# Patient Record
Sex: Male | Born: 1950 | Race: Black or African American | Hispanic: No | Marital: Married | Smoking: Never smoker
Health system: Southern US, Community
[De-identification: ages and names within clinical notes are randomized; demographics above are authoritative.]

## PROBLEM LIST (undated history)

## (undated) DIAGNOSIS — K259 Gastric ulcer, unspecified as acute or chronic, without hemorrhage or perforation: Secondary | ICD-10-CM

## (undated) DIAGNOSIS — K219 Gastro-esophageal reflux disease without esophagitis: Secondary | ICD-10-CM

## (undated) DIAGNOSIS — R12 Heartburn: Secondary | ICD-10-CM

## (undated) DIAGNOSIS — E7800 Pure hypercholesterolemia, unspecified: Secondary | ICD-10-CM

## (undated) DIAGNOSIS — I10 Essential (primary) hypertension: Secondary | ICD-10-CM

## (undated) DIAGNOSIS — C7490 Malignant neoplasm of unspecified part of unspecified adrenal gland: Secondary | ICD-10-CM

## (undated) DIAGNOSIS — E220 Acromegaly and pituitary gigantism: Secondary | ICD-10-CM

## (undated) DIAGNOSIS — E559 Vitamin D deficiency, unspecified: Secondary | ICD-10-CM

## (undated) DIAGNOSIS — E237 Disorder of pituitary gland, unspecified: Secondary | ICD-10-CM

## (undated) DIAGNOSIS — G4730 Sleep apnea, unspecified: Secondary | ICD-10-CM

## (undated) HISTORY — DX: Essential (primary) hypertension: I10

## (undated) HISTORY — DX: Disorder of pituitary gland, unspecified: E23.7

## (undated) HISTORY — DX: Pure hypercholesterolemia, unspecified: E78.00

## (undated) HISTORY — DX: Gastro-esophageal reflux disease without esophagitis: K21.9

## (undated) HISTORY — DX: Gastric ulcer, unspecified as acute or chronic, without hemorrhage or perforation: K25.9

## (undated) HISTORY — DX: Malignant neoplasm of unspecified part of unspecified adrenal gland: C74.90

## (undated) HISTORY — PX: CHOLECYSTECTOMY: SHX55

## (undated) HISTORY — DX: Sleep apnea, unspecified: G47.30

## (undated) HISTORY — DX: Vitamin D deficiency, unspecified: E55.9

## (undated) HISTORY — DX: Acromegaly and pituitary gigantism: E22.0

## (undated) HISTORY — DX: Heartburn: R12

---

## 1989-08-18 HISTORY — PX: FACIAL RECONSTRUCTION SURGERY: SHX631

## 2003-08-19 HISTORY — PX: ADRENALECTOMY: SHX876

## 2015-08-19 HISTORY — PX: PROSTATE BIOPSY: SHX241

## 2020-03-15 DIAGNOSIS — E785 Hyperlipidemia, unspecified: Secondary | ICD-10-CM | POA: Diagnosis not present

## 2020-03-15 DIAGNOSIS — K2100 Gastro-esophageal reflux disease with esophagitis, without bleeding: Secondary | ICD-10-CM | POA: Diagnosis not present

## 2020-03-15 DIAGNOSIS — E291 Testicular hypofunction: Secondary | ICD-10-CM | POA: Diagnosis not present

## 2020-03-15 DIAGNOSIS — Z0001 Encounter for general adult medical examination with abnormal findings: Secondary | ICD-10-CM | POA: Diagnosis not present

## 2020-03-15 DIAGNOSIS — I10 Essential (primary) hypertension: Secondary | ICD-10-CM | POA: Diagnosis not present

## 2020-03-15 DIAGNOSIS — Z79899 Other long term (current) drug therapy: Secondary | ICD-10-CM | POA: Diagnosis not present

## 2020-03-29 DIAGNOSIS — Z0001 Encounter for general adult medical examination with abnormal findings: Secondary | ICD-10-CM | POA: Diagnosis not present

## 2020-03-29 DIAGNOSIS — K2100 Gastro-esophageal reflux disease with esophagitis, without bleeding: Secondary | ICD-10-CM | POA: Diagnosis not present

## 2020-03-29 DIAGNOSIS — Z1331 Encounter for screening for depression: Secondary | ICD-10-CM | POA: Diagnosis not present

## 2020-03-29 DIAGNOSIS — I10 Essential (primary) hypertension: Secondary | ICD-10-CM | POA: Diagnosis not present

## 2020-03-29 DIAGNOSIS — Z1389 Encounter for screening for other disorder: Secondary | ICD-10-CM | POA: Diagnosis not present

## 2020-03-29 DIAGNOSIS — E785 Hyperlipidemia, unspecified: Secondary | ICD-10-CM | POA: Diagnosis not present

## 2020-04-29 DIAGNOSIS — K2100 Gastro-esophageal reflux disease with esophagitis, without bleeding: Secondary | ICD-10-CM | POA: Diagnosis not present

## 2020-04-29 DIAGNOSIS — I10 Essential (primary) hypertension: Secondary | ICD-10-CM | POA: Diagnosis not present

## 2020-05-08 DIAGNOSIS — Z23 Encounter for immunization: Secondary | ICD-10-CM | POA: Diagnosis not present

## 2020-05-09 ENCOUNTER — Ambulatory Visit (INDEPENDENT_AMBULATORY_CARE_PROVIDER_SITE_OTHER): Payer: Medicare HMO | Admitting: Urology

## 2020-05-09 ENCOUNTER — Encounter: Payer: Self-pay | Admitting: Urology

## 2020-05-09 ENCOUNTER — Other Ambulatory Visit: Payer: Self-pay

## 2020-05-09 VITALS — BP 133/84 | HR 62 | Temp 97.5°F | Ht 69.0 in | Wt 215.0 lb

## 2020-05-09 DIAGNOSIS — E291 Testicular hypofunction: Secondary | ICD-10-CM

## 2020-05-09 DIAGNOSIS — N5201 Erectile dysfunction due to arterial insufficiency: Secondary | ICD-10-CM

## 2020-05-09 LAB — MICROSCOPIC EXAMINATION
Bacteria, UA: NONE SEEN
Epithelial Cells (non renal): NONE SEEN /hpf (ref 0–10)
Renal Epithel, UA: NONE SEEN /hpf
WBC, UA: NONE SEEN /hpf (ref 0–5)

## 2020-05-09 LAB — URINALYSIS, ROUTINE W REFLEX MICROSCOPIC
Bilirubin, UA: NEGATIVE
Glucose, UA: NEGATIVE
Ketones, UA: NEGATIVE
Leukocytes,UA: NEGATIVE
Nitrite, UA: NEGATIVE
Protein,UA: NEGATIVE
Specific Gravity, UA: 1.025 (ref 1.005–1.030)
Urobilinogen, Ur: 1 mg/dL (ref 0.2–1.0)
pH, UA: 5.5 (ref 5.0–7.5)

## 2020-05-09 MED ORDER — SILDENAFIL CITRATE 100 MG PO TABS
100.0000 mg | ORAL_TABLET | Freq: Every day | ORAL | 5 refills | Status: DC | PRN
Start: 2020-05-09 — End: 2021-07-10

## 2020-05-09 MED ORDER — TESTOSTERONE CYPIONATE 200 MG/ML IM SOLN: 100.0000 mg | INTRAMUSCULAR | 0 refills | Status: DC

## 2020-05-09 NOTE — Patient Instructions (Signed)
Testosterone injection What is this medicine? TESTOSTERONE (tes TOS ter one) is the main male hormone. It supports normal male development such as muscle growth, facial hair, and deep voice. It is used in males to treat low testosterone levels. This medicine may be used for other purposes; ask your health care provider or pharmacist if you have questions. COMMON BRAND NAME(S): Andro-L.A., Aveed, Delatestryl, Depo-Testosterone, Virilon What should I tell my health care provider before I take this medicine? They need to know if you have any of these conditions:  cancer  diabetes  heart disease  kidney disease  liver disease  lung disease  prostate disease  an unusual or allergic reaction to testosterone, other medicines, foods, dyes, or preservatives  pregnant or trying to get pregnant  breast-feeding How should I use this medicine? This medicine is for injection into a muscle. It is usually given by a health care professional in a hospital or clinic setting. Contact your pediatrician regarding the use of this medicine in children. While this medicine may be prescribed for children as young as 54 years of age for selected conditions, precautions do apply. Overdosage: If you think you have taken too much of this medicine contact a poison control center or emergency room at once. NOTE: This medicine is only for you. Do not share this medicine with others. What if I miss a dose? Try not to miss a dose. Your doctor or health care professional will tell you when your next injection is due. Notify the office if you are unable to keep an appointment. What may interact with this medicine?  medicines for diabetes  medicines that treat or prevent blood clots like warfarin  oxyphenbutazone  propranolol  steroid medicines like prednisone or cortisone This list may not describe all possible interactions. Give your health care provider a list of all the medicines, herbs, non-prescription  drugs, or dietary supplements you use. Also tell them if you smoke, drink alcohol, or use illegal drugs. Some items may interact with your medicine. What should I watch for while using this medicine? Visit your doctor or health care professional for regular checks on your progress. They will need to check the level of testosterone in your blood. This medicine is only approved for use in men who have low levels of testosterone related to certain medical conditions. Heart attacks and strokes have been reported with the use of this medicine. Notify your doctor or health care professional and seek emergency treatment if you develop breathing problems; changes in vision; confusion; chest pain or chest tightness; sudden arm pain; severe, sudden headache; trouble speaking or understanding; sudden numbness or weakness of the face, arm or leg; loss of balance or coordination. Talk to your doctor about the risks and benefits of this medicine. This medicine may affect blood sugar levels. If you have diabetes, check with your doctor or health care professional before you change your diet or the dose of your diabetic medicine. Testosterone injections are not commonly used in women. Women should inform their doctor if they wish to become pregnant or think they might be pregnant. There is a potential for serious side effects to an unborn child. Talk to your health care professional or pharmacist for more information. Talk with your doctor or health care professional about your birth control options while taking this medicine. This drug is banned from use in athletes by most athletic organizations. What side effects may I notice from receiving this medicine? Side effects that you should report to  your doctor or health care professional as soon as possible:  allergic reactions like skin rash, itching or hives, swelling of the face, lips, or tongue  breast enlargement  breathing problems  changes in emotions or  moods  deep or hoarse voice  irregular menstrual periods  signs and symptoms of liver injury like dark yellow or brown urine; general ill feeling or flu-like symptoms; light-colored stools; loss of appetite; nausea; right upper belly pain; unusually weak or tired; yellowing of the eyes or skin  stomach pain  swelling of the ankles, feet, hands  too frequent or persistent erections  trouble passing urine or change in the amount of urine Side effects that usually do not require medical attention (report to your doctor or health care professional if they continue or are bothersome):  acne  change in sex drive or performance  facial hair growth  hair loss  headache This list may not describe all possible side effects. Call your doctor for medical advice about side effects. You may report side effects to FDA at 1-800-FDA-1088. Where should I keep my medicine? Keep out of the reach of children. This medicine can be abused. Keep your medicine in a safe place to protect it from theft. Do not share this medicine with anyone. Selling or giving away this medicine is dangerous and against the law. Store at room temperature between 20 and 25 degrees C (68 and 77 degrees F). Do not freeze. Protect from light. Follow the directions for the product you are prescribed. Throw away any unused medicine after the expiration date. NOTE: This sheet is a summary. It may not cover all possible information. If you have questions about this medicine, talk to your doctor, pharmacist, or health care provider.  2020 Elsevier/Gold Standard (2015-09-08 07:33:55)  

## 2020-05-09 NOTE — Progress Notes (Signed)
Urological Symptom Review  Patient is experiencing the following symptoms: Frequent urination Hard to postpone urination Get up at night to urinate Leakage of urine Trouble starting stream Injury to kidneys/bladder Erection problems (male only)   Review of Systems  Gastrointestinal (upper)  : Negative for upper GI symptoms  Gastrointestinal (lower) : Constipation  Constitutional : Negative for symptoms  Skin: Negative for skin symptoms  Eyes: Negative for eye symptoms  Ear/Nose/Throat : Negative for Ear/Nose/Throat symptoms  Hematologic/Lymphatic: Negative for Hematologic/Lymphatic symptoms  Cardiovascular : Negative for cardiovascular symptoms  Respiratory : Negative for respiratory symptoms  Endocrine: Negative for endocrine symptoms  Musculoskeletal: Negative for musculoskeletal symptoms  Neurological: Negative for neurological symptoms  Psychologic: Negative for psychiatric symptoms

## 2020-05-09 NOTE — Progress Notes (Signed)
05/09/2020 9:08 AM   Suzy Bouchard 1950-12-13 [id number]  Referring provider: Rosita Fire, MD [place],  [place] [address]  Hypogonadism  HPI: Mr Holeman is a 69yo here for evaluation of hypogonadism. He has been on TRT for over 3 years. He was followed by [facility] since 2017. He had a biopsy in 2017 which was negative. He injects 100mg  every 1-2 weeks. Good energy. Good Libido. He has erectile dysfunction for which he takes 50mg  sildenafil. He has mild LUTS with weak stream in the morning. His last injection was in August.    PMH: Past Medical History:  Diagnosis Date  . Acid reflux   . 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 Heart burn   . High cholesterol   . Hypertension   . 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 Sleep apnea   . Stomach ulcer     Surgical History: Past Surgical History:  Procedure Laterality Date  . ADRENALECTOMY    . CHOLECYSTECTOMY      Home Medications:  Allergies as of 05/09/2020   No Known Allergies     Medication List       Accurate as of May 09, 2020  9:08 AM. If you have any questions, ask your nurse or doctor.        carvedilol 12.5 MG tablet Commonly known as: COREG   hydrochlorothiazide 25 MG tablet Commonly known as: HYDRODIURIL   pregabalin 75 MG capsule Commonly known as: LYRICA   sildenafil 100 MG tablet Commonly known as: VIAGRA Take 100 mg by mouth daily as needed for erectile dysfunction.   Vitamin D (Ergocalciferol) 1.25 MG (50000 UNIT) Caps capsule Commonly known as: DRISDOL       Allergies: No Known Allergies  Family History: Family History  Problem Relation Age of Onset  . Heart disease Father     Social History:  reports that he has never smoked. He has never used smokeless tobacco. No history on file for alcohol use and drug use.  ROS: All other review of systems were reviewed and are negative except what is noted above in HPI  Physical Exam: BP 133/84   Pulse 62    Temp (!) 97.5 F (36.4 C)   Ht 5\' 9"  (1.753 m)   Wt 215 lb (97.5 kg)   BMI 31.75 kg/m   Constitutional:  Alert and oriented, No acute distress. HEENT: [place] AT, moist mucus membranes.  Trachea midline, no masses. Cardiovascular: No clubbing, cyanosis, or edema. Respiratory: Normal respiratory effort, no increased work of breathing. GI: Abdomen is soft, nontender, nondistended, no abdominal masses GU: No CVA tenderness. Circumcised phallus. No masses/lesions on penis, testis, scrotum. Prostate 40g smooth no nodules no induration.  Lymph: No cervical or inguinal lymphadenopathy. Skin: No rashes, bruises or suspicious lesions. Neurologic: Grossly intact, no focal deficits, moving all 4 extremities. Psychiatric: Normal mood and affect.  Laboratory Data: No results found for: WBC, HGB, HCT, MCV, PLT  No results found for: CREATININE  No results found for: PSA  No results found for: TESTOSTERONE  No results found for: HGBA1C  Urinalysis No results found for: COLORURINE, APPEARANCEUR, LABSPEC, PHURINE, GLUCOSEU, HGBUR, BILIRUBINUR, KETONESUR, PROTEINUR, UROBILINOGEN, NITRITE, LEUKOCYTESUR  No results found for: LABMICR, [place], RBCUA, LABEPIT, MUCUS, BACTERIA  Pertinent Imaging:  No results found for this or any previous visit.  No results found for this or any previous visit.  No results found for this or any previous visit.  No results found for  this or any previous visit.  No results found for this or any previous visit.  No results found for this or any previous visit.  No results found for this or any previous visit.  No results found for this or any previous visit.   Assessment & Plan:    1. Hypogonadism male -testosterone labs - Urinalysis, Routine w reflex microscopic  2. Erectile dysfunction -refill sildenafil   No follow-ups on file.  Nicolette Bang, MD  [facility] [place]

## 2020-05-14 LAB — CBC WITH DIFFERENTIAL
Basophils Absolute: 0 10*3/uL (ref 0.0–0.2)
Basos: 1 %
EOS (ABSOLUTE): 0.1 10*3/uL (ref 0.0–0.4)
Eos: 3 %
Hematocrit: 45.6 % (ref 37.5–51.0)
Hemoglobin: 15.7 g/dL (ref 13.0–17.7)
Immature Grans (Abs): 0 10*3/uL (ref 0.0–0.1)
Immature Granulocytes: 0 %
Lymphocytes Absolute: 1.6 10*3/uL (ref 0.7–3.1)
Lymphs: 50 %
MCH: 30 pg (ref 26.6–33.0)
MCHC: 34.4 g/dL (ref 31.5–35.7)
MCV: 87 fL (ref 79–97)
Monocytes Absolute: 0.4 10*3/uL (ref 0.1–0.9)
Monocytes: 14 %
Neutrophils Absolute: 1 10*3/uL — ABNORMAL LOW (ref 1.4–7.0)
Neutrophils: 32 %
RBC: 5.23 x10E6/uL (ref 4.14–5.80)
RDW: 13 % (ref 11.6–15.4)
WBC: 3.1 10*3/uL — ABNORMAL LOW (ref 3.4–10.8)

## 2020-05-14 LAB — COMPREHENSIVE METABOLIC PANEL
ALT: 18 IU/L (ref 0–44)
AST: 17 IU/L (ref 0–40)
Albumin/Globulin Ratio: 1.9 (ref 1.2–2.2)
Albumin: 5 g/dL — ABNORMAL HIGH (ref 3.7–4.7)
Alkaline Phosphatase: 68 IU/L (ref 44–121)
BUN/Creatinine Ratio: 11 (ref 10–24)
BUN: 14 mg/dL (ref 8–27)
Bilirubin Total: 0.8 mg/dL (ref 0.0–1.2)
CO2: 21 mmol/L (ref 20–29)
Calcium: 10 mg/dL (ref 8.6–10.2)
Chloride: 100 mmol/L (ref 96–106)
Creatinine, Ser: 1.26 mg/dL (ref 0.76–1.27)
GFR calc Af Amer: 66 mL/min/{1.73_m2} (ref >59)
GFR calc non Af Amer: 57 mL/min/{1.73_m2} — ABNORMAL LOW (ref >59)
Globulin, Total: 2.7 g/dL (ref 1.5–4.5)
Glucose: 90 mg/dL (ref 65–99)
Potassium: 4.7 mmol/L (ref 3.5–5.2)
Sodium: 137 mmol/L (ref 134–144)
Total Protein: 7.7 g/dL (ref 6.0–8.5)

## 2020-05-14 LAB — TESTOSTERONE,FREE AND TOTAL
Testosterone, Free: 4.6 pg/mL — ABNORMAL LOW (ref 6.6–18.1)
Testosterone: 272 ng/dL (ref 264–916)

## 2020-05-14 LAB — PSA: Prostate Specific Ag, Serum: 1.5 ng/mL (ref 0.0–4.0)

## 2020-05-14 LAB — ESTRADIOL: Estradiol: 30.7 pg/mL (ref 7.6–42.6)

## 2020-05-16 ENCOUNTER — Other Ambulatory Visit: Payer: Self-pay

## 2020-05-16 ENCOUNTER — Ambulatory Visit: Payer: Medicare HMO

## 2020-05-16 DIAGNOSIS — N5201 Erectile dysfunction due to arterial insufficiency: Secondary | ICD-10-CM

## 2020-05-16 MED ORDER — "BD INTEGRA SYRINGE 22G X 1-1/2"" 3 ML MISC": 0 refills | Status: DC

## 2020-05-17 NOTE — Progress Notes (Addendum)
Pt taught how to inject Testosterone intramuscular bi weekly. Pt demonstrated ability to do shots. Pt supplied med.    Instructions for disposing of "Sharps" Disposal of syringes and other sharp objects is monitored by the Investment banker, operational (EPA). It is important to dispose of them properly for your safety and for the safety of others.  The EPA promotes all recycling activities, and therefore encourages you to discard medical waste sharps in sturdy, non-recyclable containers, when possible.  Your stat or community environmental programs may have other requirements or suggestions for disposing of your medical waste.  You should contact your local EPA office for any information you may need.  What container should be used Place needles, syringes, lancets and other sharp objects in a hard plastic or metal container with a screw on or tightly secured lid.  Many containers found in the household will do, or you may purchase containers specifically designed for disposal of medical wast sharps.  If a recyclable container is used to dispose of medical waste sharps, make sure that you don't mix the container with other materials to be recycled.  Since the sharps impair a containers recyclability, a container holding your medical waste sharps properly belongs with the regular household trash.  You should label the container "Not for Recycling".  In addition, make sure your sharps container is made of non breakable material and has a lid that can be securely closed (screwed on or tightly secured).  Before discarding a container, be sure to reinforce the lid with heavy-duty tape.  Do not put sharpe objects in a container you plan to recycle or return to a store, and do not use glass or clear plastic containers (see additional information below).  Finally, make sure that you keep all containers with sharp objects out of the reach of children and pets.  Your home care provider may deliver a sharps  container with your medical supplies.  If so place all needles, syringes and lancets in this container and notify the company when the container is approximately 75% full.  Your home care provider will arrange for pickup of the container.  For your safety, do NOT bring your container to the hospital or [facility] for disposal.        Tips for minimizing injection pain- -inject medicine that is at room temperature -remove all air bubbles from the syringe before injection -wait until the topical alcohol has evaporated before injecting -keep muscles in the injection area relaxed -break through the skin quickly -don't change the direction of the needle as it goes in or comes out -do not reuse disposable needles

## 2020-05-21 ENCOUNTER — Other Ambulatory Visit: Payer: Self-pay

## 2020-05-21 ENCOUNTER — Encounter: Payer: Self-pay | Admitting: "Endocrinology

## 2020-05-21 ENCOUNTER — Ambulatory Visit (INDEPENDENT_AMBULATORY_CARE_PROVIDER_SITE_OTHER): Payer: Medicare HMO | Admitting: "Endocrinology

## 2020-05-21 VITALS — BP 119/77 | HR 60 | Ht 69.0 in | Wt 196.0 lb

## 2020-05-21 DIAGNOSIS — Z86018 Personal history of other benign neoplasm: Secondary | ICD-10-CM

## 2020-05-21 DIAGNOSIS — E291 Testicular hypofunction: Secondary | ICD-10-CM

## 2020-05-21 DIAGNOSIS — E559 Vitamin D deficiency, unspecified: Secondary | ICD-10-CM

## 2020-05-21 DIAGNOSIS — E236 Other disorders of pituitary gland: Secondary | ICD-10-CM | POA: Diagnosis not present

## 2020-05-21 MED ORDER — VITAMIN D (ERGOCALCIFEROL) 1.25 MG (50000 UNIT) PO CAPS
50000.0000 [IU] | ORAL_CAPSULE | ORAL | 0 refills | Status: DC
Start: 2020-05-21 — End: 2020-09-24

## 2020-05-21 NOTE — Progress Notes (Signed)
Endocrinology Consult Note                                            05/21/2020, 4:20 PM   Subjective:    Patient ID: Michael Mills, male    DOB: 07/27/1951, PCP Rosita Fire, MD   Past Medical History:  Diagnosis Date  . Acid reflux   . 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 Heart burn   . High cholesterol   . Hypertension   . 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 Sleep apnea   . Stomach ulcer   . Vitamin D deficiency    Past Surgical History:  Procedure Laterality Date  . ADRENALECTOMY  2005  . CHOLECYSTECTOMY    . FACIAL RECONSTRUCTION SURGERY  1991  . PROSTATE BIOPSY  2017   Social History   Socioeconomic History  . Marital status: Married    Spouse name: Not on file  . Number of children: 2  . Years of education: Not on file  . Highest education level: Not on file  Occupational History  . Not on file  Tobacco Use  . Smoking status: Never Smoker  . Smokeless tobacco: Never Used  Substance and Sexual Activity  . Alcohol use: Never  . Drug use: Never  . Sexual activity: Not on file  Other Topics Concern  . Not on file  Social History Narrative  . Not on file   Social Determinants of Health   Financial Resource Strain:   . Difficulty of Paying Living Expenses: Not on file  Food Insecurity:   . Worried About Charity fundraiser in the Last Year: Not on file  . Ran Out of Food in the Last Year: Not on file  Transportation Needs:   . Lack of Transportation (Medical): Not on file  . Lack of Transportation (Non-Medical): Not on file  Physical Activity:   . Days of Exercise per Week: Not on file  . Minutes of Exercise per Session: Not on file  Stress:   . Feeling of Stress : Not on file  Social Connections:   . Frequency of Communication with Friends and Family: Not on file  . Frequency of Social Gatherings with Friends and Family: Not on file  . Attends Religious Services: Not on file  . Active Member  of Clubs or Organizations: Not on file  . Attends Archivist Meetings: Not on file  . Marital Status: Not on file   Family History  Problem Relation Age of Onset  . Heart disease Father   . Diabetes Mellitus II Father   . Cancer Mother    Outpatient Encounter Medications as of 05/21/2020  Medication Sig  . carvedilol (COREG) 12.5 MG tablet   . hydrochlorothiazide (HYDRODIURIL) 25 MG tablet   . pregabalin (LYRICA) 75 MG capsule   . sildenafil (VIAGRA) 100 MG tablet Take 1 tablet (100 mg total) by mouth daily as needed for erectile dysfunction.  . SYRINGE-NEEDLE, DISP, 3 ML (B-D INTEGRA SYRINGE) 22G X 1-1/2" 3 ML MISC To be used for Testosterone inj  . testosterone cypionate (DEPOTESTOSTERONE CYPIONATE) 200 MG/ML injection Inject 0.5 mLs (100 mg total) into the muscle every 14 (fourteen) days.  . Vitamin D, Ergocalciferol, (DRISDOL) 1.25 MG (50000 UNIT) CAPS capsule Take 1 capsule (50,000 Units  total) by mouth every 7 (seven) days.  . [DISCONTINUED] Vitamin D, Ergocalciferol, (DRISDOL) 1.25 MG (50000 UNIT) CAPS capsule    No facility-administered encounter medications on file as of 05/21/2020.   ALLERGIES: No Known Allergies  VACCINATION STATUS:  There is no immunization history on file for this patient.  HPI Michael Mills is 69 y.o. male who presents today with a medical history as above. he is being seen in consultation for history of adrenal adenoma s/p resection in 2009 , with subsequent endocrine work-up being in normal ranges.  He has moved from [place] to the area recently.  This consult is from  Rosita Fire, MD.  he is on ongoing treatment with testosterone by his local urologist.  His most recent total testosterone is 272.   He also has documented history of pituitary cyst on MRI studies done in 2017 and 2019 with no interval change.  A CT scan of brain performed in December 2020 did not show any changes or acute intracranial abnormality.  Patient does not have  any new complaints. He denies headaches, visual field loss/deficit. His most recent thyroid function tests are within normal limits.  He is currently being treated for hypertension on a stable regimen of Coreg and hydrochlorothiazide as well as vitamin D deficiency on 50,000 units of vitamin D2 weekly. -He is on Lyrica for postherpetic neuralgia.  Review of Systems  Constitutional: + Minimally changing body weight, no fatigue, no subjective hyperthermia, no subjective hypothermia Eyes: no blurry vision, no xerophthalmia ENT: no sore throat, no nodules palpated in throat, no dysphagia/odynophagia, no hoarseness Cardiovascular: no Chest Pain, no Shortness of Breath, no palpitations, no leg swelling Respiratory: no cough, no shortness of breath Gastrointestinal: no Nausea/Vomiting/Diarhhea Musculoskeletal: no muscle/joint aches Skin: no rashes Neurological: no tremors, no numbness, no tingling, no dizziness Psychiatric: no depression, no anxiety  Objective:    Vitals with BMI 05/21/2020 05/09/2020  Height 5\' 9"  5\' 9"   Weight 196 lbs 215 lbs  BMI 11.57 26.20  Systolic 355 974  Diastolic 77 84  Pulse 60 62    BP 119/77   Pulse 60   Ht 5\' 9"  (1.753 m)   Wt 196 lb (88.9 kg)   BMI 28.94 kg/m   Wt Readings from Last 3 Encounters:  05/21/20 196 lb (88.9 kg)  05/09/20 215 lb (97.5 kg)    Physical Exam  Constitutional:  Body mass index is 28.94 kg/m.,  not in acute distress, normal state of mind Eyes: PERRLA, EOMI, no exophthalmos ENT: moist mucous membranes, no gross thyromegaly, no gross cervical lymphadenopathy Cardiovascular: normal precordial activity, Regular Rate and Rhythm, no Murmur/Rubs/Gallops Respiratory:  adequate breathing efforts, no gross chest deformity, Clear to auscultation bilaterally Gastrointestinal: abdomen soft, Non -tender, No distension, Bowel Sounds present, no gross organomegaly Musculoskeletal: no gross deformities, strength intact in all four  extremities Skin: moist, warm, no rashes Neurological: no tremor with outstretched hands, Deep tendon reflexes normal in bilateral lower extremities.  CMP ( most recent) CMP     Component Value Date/Time   NA 137 05/09/2020 1104   K 4.7 05/09/2020 1104   CL 100 05/09/2020 1104   CO2 21 05/09/2020 1104   GLUCOSE 90 05/09/2020 1104   BUN 14 05/09/2020 1104   CREATININE 1.26 05/09/2020 1104   CALCIUM 10.0 05/09/2020 1104   PROT 7.7 05/09/2020 1104   ALBUMIN 5.0 (H) 05/09/2020 1104   AST 17 05/09/2020 1104   ALT 18 05/09/2020 1104   ALKPHOS 68 05/09/2020 1104  BILITOT 0.8 05/09/2020 1104   GFRNONAA 57 (L) 05/09/2020 1104   GFRAA 66 05/09/2020 1104     MRI of pituitary/brain without contrast performed on August 20, 2017 showed stable left-sided benign appearing 7.5 x 4 mm cyst/cystic focus in the upper posterior left pituitary gland.   Assessment & Plan:   1. History of adrenal adenoma -He is status post adenoma resection in 2009 in [place]. The details of his surgery and reports are not available to review. His subsequent endocrine work-up was unremarkable. He is not on adrenal  steroid replacement. He has well-controlled hypertension on carvedilol. He did not have recent abdominal imaging. He will be considered if necessary after her next visit.  2. Hypogonadism, male -He has taken testosterone supplement for several years. He obtains his prescriptions from his urologist. Most recent labs show total testosterone of two seventy-two, appropriate target for him. I did not make any changes on this. He is advised to continue testosterone 100 mg IM every 14 days. He will have total testosterone, PSA prior to his next visit.  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 At two thousand twenty CT scan of the head did not show any new findings. Patient does not have any complaints.  He will be considered for prolactin, IGF-I, a.m. cortisol, and thyroid function tests prior to his next visit.  4. Vitamin D deficiency Patient is on ongoing vitamin D supplement with vitamin D2 50,000 units weekly. He is advised to continue for the next 12 weeks.  - he is advised to maintain close follow up with Rosita Fire, MD for primary care needs.   - Time spent with the patient: 45 minutes, of which >50% was spent in  counseling him about his history of pituitary adenoma, adrenal adenoma, hypogonadism and the rest in obtaining information about his symptoms, reviewing his previous labs/studies ( including abstractions from other facilities),  evaluations, and treatments,  and developing a plan to confirm diagnosis and long term treatment based on the latest standards of care/guidelines; and documenting his care.  Michael Mills participated in the discussions, expressed understanding, and voiced agreement with the above plans.  All questions were answered to his satisfaction. he is encouraged to contact clinic should he have any questions or concerns prior to his return visit.  Follow up plan: Return in about 3 months (around 08/21/2020) for Fasting Labs  in AM B4 8.   Glade Lloyd, MD [facility] [address] [place], [place] [address] Phone: [phone]  Fax: [phone]     05/21/2020, 4:20 PM  This note was partially dictated with voice recognition software. Similar sounding words can be transcribed inadequately or may not  be corrected upon review.

## 2020-05-22 DIAGNOSIS — E559 Vitamin D deficiency, unspecified: Secondary | ICD-10-CM | POA: Insufficient documentation

## 2020-05-29 DIAGNOSIS — I10 Essential (primary) hypertension: Secondary | ICD-10-CM | POA: Diagnosis not present

## 2020-05-29 DIAGNOSIS — K2100 Gastro-esophageal reflux disease with esophagitis, without bleeding: Secondary | ICD-10-CM | POA: Diagnosis not present

## 2020-06-05 NOTE — Progress Notes (Signed)
Results mailed 

## 2020-06-07 ENCOUNTER — Ambulatory Visit: Payer: Medicare HMO | Admitting: Sports Medicine

## 2020-06-07 ENCOUNTER — Other Ambulatory Visit: Payer: Self-pay

## 2020-06-07 ENCOUNTER — Encounter: Payer: Self-pay | Admitting: Sports Medicine

## 2020-06-07 DIAGNOSIS — M79674 Pain in right toe(s): Secondary | ICD-10-CM | POA: Diagnosis not present

## 2020-06-07 DIAGNOSIS — L601 Onycholysis: Secondary | ICD-10-CM | POA: Diagnosis not present

## 2020-06-07 DIAGNOSIS — M79675 Pain in left toe(s): Secondary | ICD-10-CM

## 2020-06-07 DIAGNOSIS — B351 Tinea unguium: Secondary | ICD-10-CM

## 2020-06-07 DIAGNOSIS — L603 Nail dystrophy: Secondary | ICD-10-CM | POA: Diagnosis not present

## 2020-06-07 NOTE — Progress Notes (Signed)
Subjective: Michael Mills is a 69 y.o. male patient seen today in office with complaint of mildly painful thickened and discolored nails. Patient is desiring treatment for nail changes x years; has tried OTC topicals/Medication in the past with no improvement. PCP gave a topical that has not worked. Reports that nails are becoming difficult to manage because of the thickness. Patient has no other pedal complaints at this time.    Review of Systems  All other systems reviewed and are negative.  Patient Active Problem List   Diagnosis Date Noted  . Vitamin D deficiency 05/22/2020  . History of adrenal adenoma 05/21/2020  . 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 Hypogonadism, male 05/09/2020  . Erectile dysfunction due to arterial insufficiency 05/09/2020    Current Outpatient Medications on File Prior to Visit  Medication Sig Dispense Refill  . carvedilol (COREG) 12.5 MG tablet     . hydrochlorothiazide (HYDRODIURIL) 25 MG tablet     . pregabalin (LYRICA) 75 MG capsule     . sildenafil (VIAGRA) 100 MG tablet Take 1 tablet (100 mg total) by mouth daily as needed for erectile dysfunction. 10 tablet 5  . SYRINGE-NEEDLE, DISP, 3 ML (B-D INTEGRA SYRINGE) 22G X 1-1/2" 3 ML MISC To be used for Testosterone inj 15 each 0  . testosterone cypionate (DEPOTESTOSTERONE CYPIONATE) 200 MG/ML injection Inject 0.5 mLs (100 mg total) into the muscle every 14 (fourteen) days. 10 mL 0  . Vitamin D, Ergocalciferol, (DRISDOL) 1.25 MG (50000 UNIT) CAPS capsule Take 1 capsule (50,000 Units total) by mouth every 7 (seven) days. 12 capsule 0   No current facility-administered medications on file prior to visit.    No Known Allergies  Objective: Physical Exam  General: Well developed, nourished, no acute distress, awake, alert and oriented x 3  Vascular: Dorsalis pedis artery 2/4 bilateral, Posterior tibial artery 1/4 bilateral, skin temperature warm to warm proximal to distal bilateral lower extremities, no  varicosities, pedal hair present bilateral.  Neurological: Gross sensation present via light touch bilateral.   Dermatological: Skin is warm, dry, and supple bilateral, Nails bilateral hallux are tender, short thick, and discolored with mild subungal debris, no webspace macerations present bilateral, no open lesions present bilateral, no callus/corns/hyperkeratotic tissue present bilateral. No signs of infection bilateral.  Musculoskeletal: No symptomatic boney deformities noted bilateral. Muscular strength within normal limits without painon range of motion. No pain with calf compression bilateral.  Assessment and Plan:  Problem List Items Addressed This Visit    None    Visit Diagnoses    Nail fungal infection    -  Primary   Relevant Orders   Fungus culture w smear   Toe pain, bilateral          -Examined patient -Discussed treatment options for painful dystrophic nails  -Fungal culture was obtained by removing a portion of the hard nail itself from each of the involved toenails at Bilateral hallux using a sterile nail nipper and sent to [facility] lab. Patient tolerated the biopsy procedure well without discomfort or need for anesthesia.  -Patient to return in 4 weeks for follow up evaluation and discussion of fungal culture results or sooner if symptoms worsen.  Landis Martins, DPM

## 2020-06-14 DIAGNOSIS — I10 Essential (primary) hypertension: Secondary | ICD-10-CM | POA: Diagnosis not present

## 2020-06-14 DIAGNOSIS — H579 Unspecified disorder of eye and adnexa: Secondary | ICD-10-CM | POA: Diagnosis not present

## 2020-06-14 DIAGNOSIS — E785 Hyperlipidemia, unspecified: Secondary | ICD-10-CM | POA: Diagnosis not present

## 2020-06-15 DIAGNOSIS — H1031 Unspecified acute conjunctivitis, right eye: Secondary | ICD-10-CM | POA: Diagnosis not present

## 2020-06-26 ENCOUNTER — Telehealth: Payer: Self-pay | Admitting: Sports Medicine

## 2020-06-26 NOTE — Telephone Encounter (Signed)
Will you let Bako know that we want the PCR with Terbenfine resistance done for this patient

## 2020-06-26 NOTE — Telephone Encounter (Signed)
Bako needed clarification if the test need PCR or PCF w/ Trebenifine (sp?) WIOM#[id number]

## 2020-07-05 ENCOUNTER — Encounter: Payer: Self-pay | Admitting: Sports Medicine

## 2020-07-05 ENCOUNTER — Other Ambulatory Visit: Payer: Self-pay

## 2020-07-05 ENCOUNTER — Ambulatory Visit: Payer: Medicare HMO | Admitting: Sports Medicine

## 2020-07-05 DIAGNOSIS — B351 Tinea unguium: Secondary | ICD-10-CM

## 2020-07-05 DIAGNOSIS — M79675 Pain in left toe(s): Secondary | ICD-10-CM

## 2020-07-05 DIAGNOSIS — M79674 Pain in right toe(s): Secondary | ICD-10-CM

## 2020-07-05 NOTE — Progress Notes (Signed)
Subjective: Michael Mills is a 69 y.o. male patient seen today in office for fungal culture results. Patient has no other pedal complaints at this time.   Patient Active Problem List   Diagnosis Date Noted  . Vitamin D deficiency 05/22/2020  . History of adrenal adenoma 05/21/2020  . 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 Hypogonadism, male 05/09/2020  . Erectile dysfunction due to arterial insufficiency 05/09/2020    Current Outpatient Medications on File Prior to Visit  Medication Sig Dispense Refill  . B-D 3CC LUER-LOK SYR 22GX1" 22G X 1" 3 ML MISC     . carvedilol (COREG) 12.5 MG tablet     . hydrochlorothiazide (HYDRODIURIL) 25 MG tablet     . pregabalin (LYRICA) 75 MG capsule     . sildenafil (VIAGRA) 100 MG tablet Take 1 tablet (100 mg total) by mouth daily as needed for erectile dysfunction. 10 tablet 5  . SYRINGE-NEEDLE, DISP, 3 ML (B-D INTEGRA SYRINGE) 22G X 1-1/2" 3 ML MISC To be used for Testosterone inj 15 each 0  . testosterone cypionate (DEPOTESTOSTERONE CYPIONATE) 200 MG/ML injection Inject 0.5 mLs (100 mg total) into the muscle every 14 (fourteen) days. 10 mL 0  . tobramycin-dexamethasone (TOBRADEX) ophthalmic solution     . Vitamin D, Ergocalciferol, (DRISDOL) 1.25 MG (50000 UNIT) CAPS capsule Take 1 capsule (50,000 Units total) by mouth every 7 (seven) days. 12 capsule 0   No current facility-administered medications on file prior to visit.    No Known Allergies  Objective: Physical Exam  General: Well developed, nourished, no acute distress, awake, alert and oriented x 3  Vascular: Dorsalis pedis artery 2/4 bilateral, Posterior tibial artery 1/4 bilateral, skin temperature warm to warm proximal to distal bilateral lower extremities, no varicosities, pedal hair present bilateral.  Neurological: Gross sensation present via light touch bilateral.   Dermatological: Skin is warm, dry, and supple bilateral, Nails bilateral hallux are tender, short thick, and  discolored with mild subungal debris, no webspace macerations present bilateral, no open lesions present bilateral, no callus/corns/hyperkeratotic tissue present bilateral. No signs of infection bilateral.  Musculoskeletal: No symptomatic boney deformities noted bilateral. Muscular strength within normal limits without painon range of motion. No pain with calf compression bilateral.   Fungal culture + fungus  Assessment and Plan:  Problem List Items Addressed This Visit    None    Visit Diagnoses    Nail fungal infection    -  Primary   Toe pain, bilateral          -Examined patient -Discussed treatment options for painful mycotic nails -Patient opt for oral Lamisil with full understanding of medication risks; ordered LFTs for review if within normal limits will proceed with sending Rx to pharmacy for lamisil 250mg  PO daily. Anticipate 12 week course.  -Advised good hygiene habits -Patient to return in 6 weeks for follow up evaluation or sooner if symptoms worsen.  Landis Martins, DPM

## 2020-07-06 ENCOUNTER — Other Ambulatory Visit: Payer: Self-pay | Admitting: Sports Medicine

## 2020-07-06 LAB — HEPATIC FUNCTION PANEL
ALT: 16 IU/L (ref 0–44)
AST: 11 IU/L (ref 0–40)
Albumin: 4.6 g/dL (ref 3.7–4.7)
Alkaline Phosphatase: 74 IU/L (ref 44–121)
Bilirubin Total: 0.6 mg/dL (ref 0.0–1.2)
Bilirubin, Direct: 0.18 mg/dL (ref 0.00–0.40)
Total Protein: 7.3 g/dL (ref 6.0–8.5)

## 2020-07-06 MED ORDER — TERBINAFINE HCL 250 MG PO TABS: 250.0000 mg | ORAL_TABLET | Freq: Every day | ORAL | 0 refills | Status: DC

## 2020-07-06 NOTE — Progress Notes (Signed)
LFT normal lamisil sent to pharmacy

## 2020-07-10 DIAGNOSIS — H5213 Myopia, bilateral: Secondary | ICD-10-CM | POA: Diagnosis not present

## 2020-07-10 DIAGNOSIS — H524 Presbyopia: Secondary | ICD-10-CM | POA: Diagnosis not present

## 2020-07-10 DIAGNOSIS — H2513 Age-related nuclear cataract, bilateral: Secondary | ICD-10-CM | POA: Diagnosis not present

## 2020-07-10 DIAGNOSIS — H52203 Unspecified astigmatism, bilateral: Secondary | ICD-10-CM | POA: Diagnosis not present

## 2020-07-16 ENCOUNTER — Telehealth: Payer: Self-pay

## 2020-07-16 DIAGNOSIS — I10 Essential (primary) hypertension: Secondary | ICD-10-CM | POA: Diagnosis not present

## 2020-07-16 DIAGNOSIS — R42 Dizziness and giddiness: Secondary | ICD-10-CM | POA: Diagnosis not present

## 2020-07-16 DIAGNOSIS — K2100 Gastro-esophageal reflux disease with esophagitis, without bleeding: Secondary | ICD-10-CM | POA: Diagnosis not present

## 2020-07-16 NOTE — Telephone Encounter (Signed)
Pt called wanting a nurse appt for help with Testosterone inj. He said some one had shown him prior but he wasn't sure he was doing it right so they told him to call when it was time to get another and   make an appt to come back in. Appt made for Tuesday the 30th [place].

## 2020-07-17 ENCOUNTER — Other Ambulatory Visit: Payer: Self-pay

## 2020-07-17 ENCOUNTER — Ambulatory Visit (INDEPENDENT_AMBULATORY_CARE_PROVIDER_SITE_OTHER): Payer: Medicare HMO

## 2020-07-17 DIAGNOSIS — E291 Testicular hypofunction: Secondary | ICD-10-CM

## 2020-07-17 NOTE — Patient Instructions (Signed)

## 2020-07-17 NOTE — Progress Notes (Signed)
Pt taught how to inject Testosterone intramuscular. Pt was able to demonstrate medication prep and aministration. All questions and concerns addressed. Pt supplied med.

## 2020-07-24 ENCOUNTER — Ambulatory Visit
Admission: EM | Admit: 2020-07-24 | Discharge: 2020-07-24 | Disposition: A | Discharge status: Home / Self Care | Payer: Medicare HMO | Attending: Internal Medicine | Admitting: Internal Medicine

## 2020-07-24 ENCOUNTER — Encounter: Payer: Self-pay | Admitting: Internal Medicine

## 2020-07-24 ENCOUNTER — Other Ambulatory Visit: Payer: Self-pay

## 2020-07-24 DIAGNOSIS — R42 Dizziness and giddiness: Secondary | ICD-10-CM

## 2020-07-24 DIAGNOSIS — E236 Other disorders of pituitary gland: Secondary | ICD-10-CM | POA: Diagnosis not present

## 2020-07-24 DIAGNOSIS — E559 Vitamin D deficiency, unspecified: Secondary | ICD-10-CM | POA: Diagnosis not present

## 2020-07-24 NOTE — Discharge Instructions (Addendum)
Push oral fluids  If symptom worsens ,please return to urgent care to be re-evaluated. Follow-up with primary care physician to review the results and make further recommendations.

## 2020-07-24 NOTE — ED Triage Notes (Signed)
Pt presents with c/o intermittent dizziness for past week . Pt states that it doesn't last "too long " when it occurs, denies any current dizziness

## 2020-07-24 NOTE — ED Provider Notes (Signed)
[facility]    CSN: [id number] Arrival date & time: 07/24/20  0901      History   Chief Complaint Chief Complaint  Patient presents with  . Dizziness    HPI Michael Mills is a 69 y.o. male comes to the urgent care with complaints of intermittent dizziness over the past week.  Patient says that the dizziness started spontaneously and last for about 2 to 3 minutes.  He denies any spinning of the room or sensation of spinning around.  He denies any hearing difficulties or ringing in the ears.  No fever or chills.  No recent upper respiratory infection symptoms.  No recent diarrhea.  No changes in the patient's medications.  No falls or head injury.  No sick contacts.Marland Kitchen   HPI  Past Medical History:  Diagnosis Date  . Acid reflux   . 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 Heart burn   . High cholesterol   . Hypertension   . Pituitary gland disorder ([place])   . Sleep apnea   . Stomach ulcer   . Vitamin D deficiency     Patient Active Problem List   Diagnosis Date Noted  . Vitamin D deficiency 05/22/2020  . History of adrenal adenoma 05/21/2020  . 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 Hypogonadism, male 05/09/2020  . Erectile dysfunction due to arterial insufficiency 05/09/2020    Past Surgical History:  Procedure Laterality Date  . ADRENALECTOMY  2005  . CHOLECYSTECTOMY    . FACIAL RECONSTRUCTION SURGERY  1991  . PROSTATE BIOPSY  2017       Home Medications    Prior to Admission medications   Medication Sig Start Date End Date Taking? Authorizing Provider  B-D 3CC LUER-LOK SYR 22GX1" 22G X 1" 3 ML MISC  05/21/20   [provider]  carvedilol (COREG) 12.5 MG tablet  04/13/20   [provider]  hydrochlorothiazide (HYDRODIURIL) 25 MG tablet  03/15/20   [provider]  pregabalin (LYRICA) 75 MG capsule  02/23/20   [provider]  sildenafil (VIAGRA) 100 MG tablet Take 1 tablet (100 mg  total) by mouth daily as needed for erectile dysfunction. 05/09/20   McKenzie, Candee Furbish, MD  SYRINGE-NEEDLE, DISP, 3 ML (B-D INTEGRA SYRINGE) 22G X 1-1/2" 3 ML MISC To be used for Testosterone inj 05/16/20   McKenzie, Candee Furbish, MD  terbinafine (LAMISIL) 250 MG tablet Take 1 tablet (250 mg total) by mouth daily. 07/06/20   Landis Martins, DPM  testosterone cypionate (DEPOTESTOSTERONE CYPIONATE) 200 MG/ML injection Inject 0.5 mLs (100 mg total) into the muscle every 14 (fourteen) days. 05/09/20   McKenzie, Candee Furbish, MD  tobramycin-dexamethasone [facility]) ophthalmic solution  06/18/20   [provider]  Vitamin D, Ergocalciferol, (DRISDOL) 1.25 MG (50000 UNIT) CAPS capsule Take 1 capsule (50,000 Units total) by mouth every 7 (seven) days. 05/21/20   Cassandria Anger, MD    Family History Family History  Problem Relation Age of Onset  . Heart disease Father   . Diabetes Mellitus II Father   . Cancer Mother     Social History Social History   Tobacco Use  . Smoking status: Never Smoker  . Smokeless tobacco: Never Used  Substance Use Topics  . Alcohol use: Never  . Drug use: Never     Allergies   Patient has no known allergies.   Review of Systems Review of Systems  Constitutional: Negative.  HENT: Negative.   Respiratory: Negative.   Gastrointestinal: Negative for abdominal pain, diarrhea, nausea and vomiting.  Genitourinary: Negative.   Neurological: Positive for dizziness and light-headedness. Negative for weakness and headaches.     Physical Exam Triage Vital Signs ED Triage Vitals  Enc Vitals Group     BP 07/24/20 0924 (!) 151/98     Pulse Rate 07/24/20 0924 64     Resp 07/24/20 0924 20     Temp 07/24/20 0924 98.1 F (36.7 C)     Temp src --      SpO2 07/24/20 0924 97 %     Weight --      Height --      Head Circumference --      Peak Flow --      Pain Score 07/24/20 0923 0     Pain Loc --      Pain Edu? --      Excl. in [place]? --     Orthostatic VS for the past 24 hrs:  BP- Lying Pulse- Lying BP- Sitting Pulse- Sitting BP- Standing at 0 minutes Pulse- Standing at 0 minutes  07/24/20 0928 (!) 144/91 64 (!) 151/98 64 (!) 149/96 66    Updated Vital Signs BP (!) 151/98   Pulse 64   Temp 98.1 F (36.7 C)   Resp 20   SpO2 97%   Visual Acuity Right Eye Distance:   Left Eye Distance:   Bilateral Distance:    Right Eye Near:   Left Eye Near:    Bilateral Near:     Physical Exam Vitals and nursing note reviewed.  Constitutional:      General: He is not in acute distress.    Appearance: He is not ill-appearing.  Cardiovascular:     Rate and Rhythm: Normal rate and regular rhythm.     Heart sounds: No murmur heard.  No friction rub.  Pulmonary:     Effort: Pulmonary effort is normal. No respiratory distress.     Breath sounds: Normal breath sounds. No wheezing or rhonchi.  Musculoskeletal:        General: Normal range of motion.  Skin:    General: Skin is warm.  Neurological:     General: No focal deficit present.     Mental Status: He is alert and oriented to person, place, and time. Mental status is at baseline.     Cranial Nerves: No cranial nerve deficit.     Motor: No weakness.     Coordination: Coordination normal.     Gait: Gait normal.      UC Treatments / Results  Labs (all labs ordered are listed, but only abnormal results are displayed) Labs Reviewed  BASIC METABOLIC PANEL  CBC WITH DIFFERENTIAL/PLATELET  VITAMIN D 25 HYDROXY (VIT D DEFICIENCY, FRACTURES)    EKG   Radiology No results found.  Procedures Procedures (including critical care time)  Medications Ordered in UC Medications - No data to display  Initial Impression / Assessment and Plan / UC Course  I have reviewed the triage vital signs and the nursing notes.  Pertinent labs & imaging results that were available during my care of the patient were reviewed by me and considered in my medical decision making (see  chart for details).     1.  Dizziness: EKG shows sinus bradycardia CBC, BMP, vitamin D levels done Patient will follow up with primary care physician for further evaluation Orthostatic precautions were given to the patient Patient verbalized  understanding. Final Clinical Impressions(s) / UC Diagnoses   Final diagnoses:  Dizziness and giddiness     Discharge Instructions     Push oral fluids  If symptom worsens ,please return to urgent care to be re-evaluated. Follow-up with primary care physician to review the results and make further recommendations.   ED Prescriptions    None     PDMP not reviewed this encounter.   Chase Picket, MD 07/24/20 1053

## 2020-07-25 LAB — CBC WITH DIFFERENTIAL/PLATELET
Basophils Absolute: 0 10*3/uL (ref 0.0–0.2)
Basos: 0 %
EOS (ABSOLUTE): 0 10*3/uL (ref 0.0–0.4)
Eos: 2 %
Hematocrit: 48.3 % (ref 37.5–51.0)
Hemoglobin: 16.4 g/dL (ref 13.0–17.7)
Immature Grans (Abs): 0 10*3/uL (ref 0.0–0.1)
Immature Granulocytes: 0 %
Lymphocytes Absolute: 1.5 10*3/uL (ref 0.7–3.1)
Lymphs: 54 %
MCH: 30.9 pg (ref 26.6–33.0)
MCHC: 34 g/dL (ref 31.5–35.7)
MCV: 91 fL (ref 79–97)
Monocytes Absolute: 0.3 10*3/uL (ref 0.1–0.9)
Monocytes: 12 %
Neutrophils Absolute: 0.9 10*3/uL — ABNORMAL LOW (ref 1.4–7.0)
Neutrophils: 32 %
Platelets: 164 10*3/uL (ref 150–450)
RBC: 5.31 x10E6/uL (ref 4.14–5.80)
RDW: 12.7 % (ref 11.6–15.4)
WBC: 2.7 10*3/uL — ABNORMAL LOW (ref 3.4–10.8)

## 2020-07-25 LAB — BASIC METABOLIC PANEL
BUN/Creatinine Ratio: 8 — ABNORMAL LOW (ref 10–24)
BUN: 8 mg/dL (ref 8–27)
CO2: 22 mmol/L (ref 20–29)
Calcium: 9.5 mg/dL (ref 8.6–10.2)
Chloride: 103 mmol/L (ref 96–106)
Creatinine, Ser: 0.99 mg/dL (ref 0.76–1.27)
GFR calc Af Amer: 88 mL/min/{1.73_m2} (ref >59)
GFR calc non Af Amer: 76 mL/min/{1.73_m2} (ref >59)
Glucose: 97 mg/dL (ref 65–99)
Potassium: 4.9 mmol/L (ref 3.5–5.2)
Sodium: 138 mmol/L (ref 134–144)

## 2020-07-25 LAB — VITAMIN D 25 HYDROXY (VIT D DEFICIENCY, FRACTURES): Vit D, 25-Hydroxy: 56.2 ng/mL (ref 30.0–100.0)

## 2020-07-30 DIAGNOSIS — E785 Hyperlipidemia, unspecified: Secondary | ICD-10-CM | POA: Diagnosis not present

## 2020-07-30 DIAGNOSIS — I10 Essential (primary) hypertension: Secondary | ICD-10-CM | POA: Diagnosis not present

## 2020-07-30 DIAGNOSIS — R42 Dizziness and giddiness: Secondary | ICD-10-CM | POA: Diagnosis not present

## 2020-08-16 DIAGNOSIS — E291 Testicular hypofunction: Secondary | ICD-10-CM | POA: Diagnosis not present

## 2020-08-16 DIAGNOSIS — Z86018 Personal history of other benign neoplasm: Secondary | ICD-10-CM | POA: Diagnosis not present

## 2020-08-16 DIAGNOSIS — E236 Other disorders of pituitary gland: Secondary | ICD-10-CM | POA: Diagnosis not present

## 2020-08-20 LAB — INSULIN-LIKE GROWTH FACTOR: Insulin-Like GF-1: 150 ng/mL (ref 53–222)

## 2020-08-20 LAB — CORTISOL-AM, BLOOD: Cortisol - AM: 15.2 ug/dL (ref 6.2–19.4)

## 2020-08-20 LAB — TSH: TSH: 3.16 u[IU]/mL (ref 0.450–4.500)

## 2020-08-20 LAB — TESTOSTERONE, FREE, TOTAL, SHBG
Sex Hormone Binding: 36.9 nmol/L (ref 19.3–76.4)
Testosterone, Free: 5.4 pg/mL — ABNORMAL LOW (ref 6.6–18.1)
Testosterone: 224 ng/dL — ABNORMAL LOW (ref 264–916)

## 2020-08-20 LAB — PROLACTIN: Prolactin: 14 ng/mL (ref 4.0–15.2)

## 2020-08-20 LAB — T4, FREE: Free T4: 1.11 ng/dL (ref 0.82–1.77)

## 2020-08-20 LAB — PSA: Prostate Specific Ag, Serum: 3.4 ng/mL (ref 0.0–4.0)

## 2020-08-22 ENCOUNTER — Ambulatory Visit (INDEPENDENT_AMBULATORY_CARE_PROVIDER_SITE_OTHER): Payer: Medicare HMO | Admitting: "Endocrinology

## 2020-08-22 ENCOUNTER — Other Ambulatory Visit: Payer: Self-pay

## 2020-08-22 ENCOUNTER — Encounter: Payer: Self-pay | Admitting: "Endocrinology

## 2020-08-22 VITALS — BP 128/79 | HR 60 | Ht 69.0 in | Wt 199.4 lb

## 2020-08-22 DIAGNOSIS — E559 Vitamin D deficiency, unspecified: Secondary | ICD-10-CM | POA: Diagnosis not present

## 2020-08-22 DIAGNOSIS — E236 Other disorders of pituitary gland: Secondary | ICD-10-CM | POA: Diagnosis not present

## 2020-08-22 DIAGNOSIS — E291 Testicular hypofunction: Secondary | ICD-10-CM

## 2020-08-22 NOTE — Progress Notes (Signed)
08/22/2020, 12:27 PM  Endocrinology follow-up note   Subjective:    Patient ID: Michael Mills, male    DOB: 21-Nov-1950, PCP Michael Gully, MD   Past Medical History:  Diagnosis Date  . Acid reflux   . Acromegaly and pituitary gigantism ([facility])   . Adrenal gland cancer ([facility])   . Heart burn   . High cholesterol   . Hypertension   . Pituitary gland disorder ([facility])   . Sleep apnea   . Stomach ulcer   . Vitamin D deficiency    Past Surgical History:  Procedure Laterality Date  . ADRENALECTOMY  2005  . CHOLECYSTECTOMY    . FACIAL RECONSTRUCTION SURGERY  1991  . PROSTATE BIOPSY  2017   Social History   Socioeconomic History  . Marital status: Married    Spouse name: Not on file  . Number of children: 2  . Years of education: Not on file  . Highest education level: Not on file  Occupational History  . Not on file  Tobacco Use  . Smoking status: Never Smoker  . Smokeless tobacco: Never Used  Substance and Sexual Activity  . Alcohol use: Never  . Drug use: Never  . Sexual activity: Not on file  Other Topics Concern  . Not on file  Social History Narrative  . Not on file   Social Determinants of Health   Financial Resource Strain: Not on file  Food Insecurity: Not on file  Transportation Needs: Not on file  Physical Activity: Not on file  Stress: Not on file  Social Connections: Not on file   Family History  Problem Relation Age of Onset  . Heart disease Father   . Diabetes Mellitus II Father   . Cancer Mother    Outpatient Encounter Medications as of 08/22/2020  Medication Sig  . B-D 3CC LUER-LOK SYR 22GX1" 22G X 1" 3 ML MISC   . carvedilol (COREG) 12.5 MG tablet   . hydrochlorothiazide (HYDRODIURIL) 25 MG tablet   . pregabalin (LYRICA) 75 MG capsule   . sildenafil (VIAGRA) 100 MG tablet Take 1 tablet (100 mg total) by mouth daily as needed for erectile dysfunction.  . SYRINGE-NEEDLE, DISP, 3 ML (B-D  INTEGRA SYRINGE) 22G X 1-1/2" 3 ML MISC To be used for Testosterone inj  . terbinafine (LAMISIL) 250 MG tablet Take 1 tablet (250 mg total) by mouth daily.  Marland Kitchen testosterone cypionate (DEPOTESTOSTERONE CYPIONATE) 200 MG/ML injection Inject 0.5 mLs (100 mg total) into the muscle every 14 (fourteen) days.  Marland Kitchen tobramycin-dexamethasone (TOBRADEX) ophthalmic solution   . Vitamin D, Ergocalciferol, (DRISDOL) 1.25 MG (50000 UNIT) CAPS capsule Take 1 capsule (50,000 Units total) by mouth every 7 (seven) days.   No facility-administered encounter medications on file as of 08/22/2020.   ALLERGIES: No Known Allergies  VACCINATION STATUS:  There is no immunization history on file for this patient.  HPI Michael Mills is 70 y.o. male who presents today for follow-up after he was seen in consultation last visit due to his history of  adrenal adenoma s/p resection in 2009 , with subsequent endocrine work-up being in normal ranges.  His previsit labs are all reviewed and found to be within normal range with  the exception of testosterone at 224 on supplement.    He has moved from [place] to the area recently.  See notes from his last visit.  This consult is from  Michael Fire, MD.  he is on ongoing treatment with testosterone by his local urologist.  His most recent total testosterone is 224.   He also has documented history of pituitary cyst on MRI studies done in 2017 and 2019 with no interval change.  A CT scan of brain performed in December 2020 did not show any changes or acute intracranial abnormality.  Patient does not have any new complaints. He denies headaches, visual field loss/deficit. During this visit, he complains of occasional dizziness. His most recent thyroid function tests as well as a.m. cortisol are within normal limits.  He is currently being treated for hypertension on a stable regimen of Coreg and hydrochlorothiazide as well as vitamin D deficiency on 50,000 units of vitamin D2  weekly. -He is on Lyrica for postherpetic neuralgia.  Review of Systems  Constitutional: + Minimally changing body weight, no fatigue, no subjective hyperthermia, no subjective hypothermia Eyes: no blurry vision, no xerophthalmia ENT: no sore throat, no nodules palpated in throat, no dysphagia/odynophagia, no hoarseness Cardiovascular: no Chest Pain, no Shortness of Breath, no palpitations, no leg swelling Respiratory: no cough, no shortness of breath Gastrointestinal: no Nausea/Vomiting/Diarhhea Musculoskeletal: no muscle/joint aches Skin: no rashes Neurological: no tremors, no numbness, no tingling, no dizziness Psychiatric: no depression, no anxiety  Objective:    Vitals with BMI 08/22/2020 07/24/2020 05/21/2020  Height 5\' 9"  - 5\' 9"   Weight 199 lbs 6 oz - 196 lbs  BMI [id number] - [id number]  Systolic 0000000 [id number]  Diastolic 79 98 77  Pulse 60 64 60    BP 128/79   Pulse 60   Ht 5\' 9"  (1.753 m)   Wt 199 lb 6.4 oz (90.4 kg)   BMI 29.45 kg/m   Wt Readings from Last 3 Encounters:  08/22/20 199 lb 6.4 oz (90.4 kg)  05/21/20 196 lb (88.9 kg)  05/09/20 215 lb (97.5 kg)    Physical Exam    CMP ( most recent) CMP     Component Value Date/Time   NA 138 07/24/2020 1016   K 4.9 07/24/2020 1016   CL 103 07/24/2020 1016   CO2 22 07/24/2020 1016   GLUCOSE 97 07/24/2020 1016   BUN 8 07/24/2020 1016   CREATININE 0.99 07/24/2020 1016   CALCIUM 9.5 07/24/2020 1016   PROT 7.3 07/05/2020 1338   ALBUMIN 4.6 07/05/2020 1338   AST 11 07/05/2020 1338   ALT 16 07/05/2020 1338   ALKPHOS 74 07/05/2020 1338   BILITOT 0.6 07/05/2020 1338   GFRNONAA 76 07/24/2020 1016   GFRAA 88 07/24/2020 1016     MRI of pituitary/brain without contrast performed on August 20, 2017 showed stable left-sided benign appearing 7.5 x 4 mm cyst/cystic focus in the upper posterior left pituitary gland.   Assessment & Plan:   1. History of adrenal adenoma -He is status post adenoma resection in 2009 in [place].  The details of his surgery and reports are not available to review. His subsequent endocrine work-up was unremarkable. He is not on adrenal  steroid replacement. He has well-controlled hypertension on carvedilol and hydrochlorothiazide.  he did not have recent abdominal imaging. He will be considered if necessary after his next visit.    2. Hypogonadism, male -He has taken testosterone supplement for several years. He obtains his prescriptions from  his urologist. Most recent labs show total testosterone of 224  appropriate target for him. I did not make any changes on this. He is advised to continue testosterone 100 mg IM every 14 days.  His PSA is 3.4, increasing from 1.5 in September 2021.  He has appointment with his urologist in February 2022.  He is encouraged to keep his appointment. He will have total testosterone.  3. Pituitary cyst ([place]) -Two MRI studies between  2017 and 2019 did not show significant change in his documented tiny pituitary cyst measuring 7.5 mm. A 2020 CT scan of the head did not show any new findings.  His thyroid function, prolactin, a.m. cortisol, as well as IGF-I are all within normal limits.    4. Vitamin D deficiency Patient is on ongoing vitamin D supplement with vitamin D2 50,000 units weekly. He is advised to continue for the next 12 weeks. His complaint of dizziness is nonspecific.  He may benefit from carotid Doppler study which he will discuss with his PMD.  - he is advised to maintain close follow up with Michael Fire, MD for primary care needs.     - Time spent on this patient care encounter:  20 minutes of which 50% was spent in  counseling and the rest reviewing  his current and  previous labs / studies and medications  doses and developing a plan for long term care. Michael Mills  participated in the discussions, expressed understanding, and voiced agreement with the above plans.  All questions were answered to his satisfaction. he is encouraged to  contact clinic should he have any questions or concerns prior to his return visit.   Follow up plan: Return in about 1 year (around 08/22/2021) for Fasting Labs  in AM B4 8.   Michael Lloyd, MD [facility] [address] [place], [place] [address] Phone: [phone]  Fax: [phone]     08/22/2020, 12:27 PM  This note was partially dictated with voice recognition software. Similar sounding words can be transcribed inadequately or may not  be corrected upon review.

## 2020-08-23 ENCOUNTER — Ambulatory Visit: Payer: Medicare HMO | Admitting: Sports Medicine

## 2020-08-30 DIAGNOSIS — I10 Essential (primary) hypertension: Secondary | ICD-10-CM | POA: Diagnosis not present

## 2020-08-30 DIAGNOSIS — E785 Hyperlipidemia, unspecified: Secondary | ICD-10-CM | POA: Diagnosis not present

## 2020-09-06 ENCOUNTER — Encounter: Payer: Self-pay | Admitting: Sports Medicine

## 2020-09-06 ENCOUNTER — Other Ambulatory Visit: Payer: Self-pay

## 2020-09-06 ENCOUNTER — Ambulatory Visit: Payer: Medicare HMO | Admitting: Sports Medicine

## 2020-09-06 DIAGNOSIS — M79674 Pain in right toe(s): Secondary | ICD-10-CM | POA: Diagnosis not present

## 2020-09-06 DIAGNOSIS — M79675 Pain in left toe(s): Secondary | ICD-10-CM

## 2020-09-06 DIAGNOSIS — Z79899 Other long term (current) drug therapy: Secondary | ICD-10-CM | POA: Diagnosis not present

## 2020-09-06 DIAGNOSIS — B351 Tinea unguium: Secondary | ICD-10-CM

## 2020-09-06 NOTE — Progress Notes (Signed)
Subjective: Michael Mills is a 70 y.o. male patient seen today in office for follow up evaluation of nail fungus on Lamisil. Patient states that he is doing well with no adverse reaction. But did have a tack in is shoe that was sticking his right great toe. Patient has no other pedal complaints at this time.   Patient Active Problem List   Diagnosis Date Noted  . Vitamin D deficiency 05/22/2020  . History of adrenal adenoma 05/21/2020  . 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 Hypogonadism, male 05/09/2020  . Erectile dysfunction due to arterial insufficiency 05/09/2020    Current Outpatient Medications on File Prior to Visit  Medication Sig Dispense Refill  . B-D 3CC LUER-LOK SYR 22GX1" 22G X 1" 3 ML MISC     . carvedilol (COREG) 12.5 MG tablet     . hydrochlorothiazide (HYDRODIURIL) 25 MG tablet     . pregabalin (LYRICA) 75 MG capsule     . sildenafil (VIAGRA) 100 MG tablet Take 1 tablet (100 mg total) by mouth daily as needed for erectile dysfunction. 10 tablet 5  . SYRINGE-NEEDLE, DISP, 3 ML (B-D INTEGRA SYRINGE) 22G X 1-1/2" 3 ML MISC To be used for Testosterone inj 15 each 0  . terbinafine (LAMISIL) 250 MG tablet Take 1 tablet (250 mg total) by mouth daily. 90 tablet 0  . testosterone cypionate (DEPOTESTOSTERONE CYPIONATE) 200 MG/ML injection Inject 0.5 mLs (100 mg total) into the muscle every 14 (fourteen) days. 10 mL 0  . tobramycin-dexamethasone (TOBRADEX) ophthalmic solution     . Vitamin D, Ergocalciferol, (DRISDOL) 1.25 MG (50000 UNIT) CAPS capsule Take 1 capsule (50,000 Units total) by mouth every 7 (seven) days. 12 capsule 0   No current facility-administered medications on file prior to visit.    No Known Allergies  Objective: Physical Exam  General: Well developed, nourished, no acute distress, awake, alert and oriented x 3  Vascular: Dorsalis pedis artery 2/4 bilateral, Posterior tibial artery 2/4 bilateral, skin temperature warm to warm proximal to distal bilateral  lower extremities, no varicosities, pedal hair present bilateral.  Neurological: Gross sensation present via light touch bilateral.   Dermatological: Skin is warm, dry, and supple bilateral, Nails 1-10 are tender, short thick, and discolored with mild subungal debris with right hallux nail most involved, minimal clearance noted at proximal nail bed, no webspace macerations present bilateral, no open lesions present bilateral esp at right great toe, no callus/corns/hyperkeratotic tissue present bilateral. No signs of infection bilateral.  Musculoskeletal: No symptomatic boney deformities noted bilateral. Muscular strength within normal limits without painon range of motion. No pain with calf compression bilateral.  Assessment and Plan:  Problem List Items Addressed This Visit   None   Visit Diagnoses    Encounter for long-term (current) use of high-risk medication    -  Primary   Nail fungal infection       Toe pain, bilateral          -Examined patient -Cont with Lamisil; a new set of LFTs were ordered; will call patient to stop medication if abnormal  -Advised good hygiene habits and educated patient on proper foot care to prevent re-infection -May try vicks vapor rub on right 1st toenail -Advised patient to check shoes to avoid stepping on another tack. Skin is healed at right great toe. No need for wound care or antibiotics.  -Patient to return in 6 weeks for follow up evaluation or sooner if symptoms worsen.  Landis Martins, DPM

## 2020-09-22 ENCOUNTER — Other Ambulatory Visit: Payer: Self-pay | Admitting: "Endocrinology

## 2020-10-04 DIAGNOSIS — E785 Hyperlipidemia, unspecified: Secondary | ICD-10-CM | POA: Diagnosis not present

## 2020-10-04 DIAGNOSIS — E291 Testicular hypofunction: Secondary | ICD-10-CM | POA: Diagnosis not present

## 2020-10-04 DIAGNOSIS — K2100 Gastro-esophageal reflux disease with esophagitis, without bleeding: Secondary | ICD-10-CM | POA: Diagnosis not present

## 2020-10-04 DIAGNOSIS — I10 Essential (primary) hypertension: Secondary | ICD-10-CM | POA: Diagnosis not present

## 2020-10-18 ENCOUNTER — Ambulatory Visit: Payer: Medicare HMO | Admitting: Sports Medicine

## 2020-11-01 DIAGNOSIS — E291 Testicular hypofunction: Secondary | ICD-10-CM | POA: Diagnosis not present

## 2020-11-01 DIAGNOSIS — I10 Essential (primary) hypertension: Secondary | ICD-10-CM | POA: Diagnosis not present

## 2020-11-13 ENCOUNTER — Other Ambulatory Visit: Payer: Self-pay | Admitting: Podiatry

## 2020-11-13 DIAGNOSIS — Z79899 Other long term (current) drug therapy: Secondary | ICD-10-CM | POA: Diagnosis not present

## 2020-11-14 LAB — HEPATIC FUNCTION PANEL
ALT: 13 IU/L (ref 0–44)
AST: 16 IU/L (ref 0–40)
Albumin: 4.9 g/dL — ABNORMAL HIGH (ref 3.7–4.7)
Alkaline Phosphatase: 73 IU/L (ref 44–121)
Bilirubin Total: 0.5 mg/dL (ref 0.0–1.2)
Bilirubin, Direct: 0.15 mg/dL (ref 0.00–0.40)
Total Protein: 7.3 g/dL (ref 6.0–8.5)

## 2020-11-14 LAB — SPECIMEN STATUS REPORT

## 2020-11-15 ENCOUNTER — Other Ambulatory Visit: Payer: Self-pay

## 2020-11-15 ENCOUNTER — Ambulatory Visit: Payer: Medicare HMO | Admitting: Sports Medicine

## 2020-11-15 ENCOUNTER — Encounter: Payer: Self-pay | Admitting: Sports Medicine

## 2020-11-15 DIAGNOSIS — M79675 Pain in left toe(s): Secondary | ICD-10-CM | POA: Diagnosis not present

## 2020-11-15 DIAGNOSIS — M79674 Pain in right toe(s): Secondary | ICD-10-CM | POA: Diagnosis not present

## 2020-11-15 DIAGNOSIS — Z79899 Other long term (current) drug therapy: Secondary | ICD-10-CM | POA: Diagnosis not present

## 2020-11-15 DIAGNOSIS — B351 Tinea unguium: Secondary | ICD-10-CM

## 2020-11-15 NOTE — Progress Notes (Signed)
Subjective: Michael Mills is a 70 y.o. male patient seen today in office for follow up evaluation of nail fungus on Lamisil.  Reports that he thinks he has a few more tablets left but otherwise is doing good.  Thinks that his feet are looking better did have one episode of feeling woozy with the medication but nothing since that one episode.  Denies any other constitutional symptoms at this time.  Patient Active Problem List   Diagnosis Date Noted  . Vitamin D deficiency 05/22/2020  . History of adrenal adenoma 05/21/2020  . 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 Hypogonadism, male 05/09/2020  . Erectile dysfunction due to arterial insufficiency 05/09/2020    Current Outpatient Medications on File Prior to Visit  Medication Sig Dispense Refill  . B-D 3CC LUER-LOK SYR 22GX1" 22G X 1" 3 ML MISC     . carvedilol (COREG) 12.5 MG tablet     . hydrochlorothiazide (HYDRODIURIL) 25 MG tablet     . pregabalin (LYRICA) 75 MG capsule     . sildenafil (VIAGRA) 100 MG tablet Take 1 tablet (100 mg total) by mouth daily as needed for erectile dysfunction. 10 tablet 5  . SYRINGE-NEEDLE, DISP, 3 ML (B-D INTEGRA SYRINGE) 22G X 1-1/2" 3 ML MISC To be used for Testosterone inj 15 each 0  . terbinafine (LAMISIL) 250 MG tablet Take 1 tablet (250 mg total) by mouth daily. 90 tablet 0  . testosterone cypionate (DEPOTESTOSTERONE CYPIONATE) 200 MG/ML injection Inject 0.5 mLs (100 mg total) into the muscle every 14 (fourteen) days. 10 mL 0  . tobramycin-dexamethasone (TOBRADEX) ophthalmic solution     . Vitamin D, Ergocalciferol, (DRISDOL) 1.25 MG (50000 UNIT) CAPS capsule TAKE 1 CAPSULE (50,000 UNITS TOTAL) BY MOUTH EVERY 7 (SEVEN) DAYS. 12 capsule 0   No current facility-administered medications on file prior to visit.    No Known Allergies  Objective: Physical Exam  General: Well developed, nourished, no acute distress, awake, alert and oriented x 3  Vascular: Dorsalis pedis artery 2/4 bilateral, Posterior  tibial artery 2/4 bilateral, skin temperature warm to warm proximal to distal bilateral lower extremities, no varicosities, pedal hair present bilateral.  Neurological: Gross sensation present via light touch bilateral.   Dermatological: Skin is warm, dry, and supple bilateral, Nails 1-10 are tender, short thick, and discolored with mild subungal debris with right hallux nail most involved, mild clearance noted at proximal nail bed.  Musculoskeletal: No symptomatic boney deformities noted bilateral. Muscular strength within normal limits without painon range of motion. No pain with calf compression bilateral.  Assessment and Plan:  Problem List Items Addressed This Visit   None   Visit Diagnoses    Encounter for long-term (current) use of high-risk medication    -  Primary   Nail fungal infection       Toe pain, bilateral          -Examined patient -Cont with Lamisil until completed -Advised good hygiene habits and educated patient on proper foot care to prevent re-infection -May try vicks vapor rub on right 1st toenail and filing it daily like previous -Patient to return in 6 months for final nail check after completing Lamisil or sooner if symptoms worsen.  Landis Martins, DPM

## 2020-12-02 DIAGNOSIS — I10 Essential (primary) hypertension: Secondary | ICD-10-CM | POA: Diagnosis not present

## 2020-12-02 DIAGNOSIS — K2100 Gastro-esophageal reflux disease with esophagitis, without bleeding: Secondary | ICD-10-CM | POA: Diagnosis not present

## 2021-02-01 DIAGNOSIS — I10 Essential (primary) hypertension: Secondary | ICD-10-CM | POA: Diagnosis not present

## 2021-02-01 DIAGNOSIS — K2100 Gastro-esophageal reflux disease with esophagitis, without bleeding: Secondary | ICD-10-CM | POA: Diagnosis not present

## 2021-03-01 DIAGNOSIS — I10 Essential (primary) hypertension: Secondary | ICD-10-CM | POA: Diagnosis not present

## 2021-03-01 DIAGNOSIS — K2100 Gastro-esophageal reflux disease with esophagitis, without bleeding: Secondary | ICD-10-CM | POA: Diagnosis not present

## 2021-03-01 DIAGNOSIS — Z6827 Body mass index (BMI) 27.0-27.9, adult: Secondary | ICD-10-CM | POA: Diagnosis not present

## 2021-03-27 DIAGNOSIS — Z1211 Encounter for screening for malignant neoplasm of colon: Secondary | ICD-10-CM | POA: Diagnosis not present

## 2021-03-27 DIAGNOSIS — Z1212 Encounter for screening for malignant neoplasm of rectum: Secondary | ICD-10-CM | POA: Diagnosis not present

## 2021-04-02 DIAGNOSIS — E291 Testicular hypofunction: Secondary | ICD-10-CM | POA: Diagnosis not present

## 2021-04-02 DIAGNOSIS — Z1331 Encounter for screening for depression: Secondary | ICD-10-CM | POA: Diagnosis not present

## 2021-04-02 DIAGNOSIS — Z0001 Encounter for general adult medical examination with abnormal findings: Secondary | ICD-10-CM | POA: Diagnosis not present

## 2021-04-02 DIAGNOSIS — I10 Essential (primary) hypertension: Secondary | ICD-10-CM | POA: Diagnosis not present

## 2021-04-02 DIAGNOSIS — Z1389 Encounter for screening for other disorder: Secondary | ICD-10-CM | POA: Diagnosis not present

## 2021-04-02 DIAGNOSIS — K2100 Gastro-esophageal reflux disease with esophagitis, without bleeding: Secondary | ICD-10-CM | POA: Diagnosis not present

## 2021-04-03 LAB — COLOGUARD: COLOGUARD: NEGATIVE

## 2021-04-04 DIAGNOSIS — Z0001 Encounter for general adult medical examination with abnormal findings: Secondary | ICD-10-CM | POA: Diagnosis not present

## 2021-04-04 DIAGNOSIS — Z79899 Other long term (current) drug therapy: Secondary | ICD-10-CM | POA: Diagnosis not present

## 2021-04-04 DIAGNOSIS — I10 Essential (primary) hypertension: Secondary | ICD-10-CM | POA: Diagnosis not present

## 2021-05-03 DIAGNOSIS — E291 Testicular hypofunction: Secondary | ICD-10-CM | POA: Diagnosis not present

## 2021-05-03 DIAGNOSIS — I10 Essential (primary) hypertension: Secondary | ICD-10-CM | POA: Diagnosis not present

## 2021-05-23 ENCOUNTER — Ambulatory Visit: Payer: Medicare HMO | Admitting: Sports Medicine

## 2021-05-23 ENCOUNTER — Other Ambulatory Visit: Payer: Self-pay | Admitting: Urology

## 2021-06-02 DIAGNOSIS — E291 Testicular hypofunction: Secondary | ICD-10-CM | POA: Diagnosis not present

## 2021-06-02 DIAGNOSIS — I10 Essential (primary) hypertension: Secondary | ICD-10-CM | POA: Diagnosis not present

## 2021-06-13 ENCOUNTER — Ambulatory Visit: Payer: Medicare HMO | Admitting: Sports Medicine

## 2021-06-18 DIAGNOSIS — Z0100 Encounter for examination of eyes and vision without abnormal findings: Secondary | ICD-10-CM | POA: Diagnosis not present

## 2021-06-18 DIAGNOSIS — H5200 Hypermetropia, unspecified eye: Secondary | ICD-10-CM | POA: Diagnosis not present

## 2021-07-03 DIAGNOSIS — E785 Hyperlipidemia, unspecified: Secondary | ICD-10-CM | POA: Diagnosis not present

## 2021-07-03 DIAGNOSIS — I10 Essential (primary) hypertension: Secondary | ICD-10-CM | POA: Diagnosis not present

## 2021-07-10 ENCOUNTER — Ambulatory Visit: Payer: Medicare HMO | Admitting: Urology

## 2021-07-10 ENCOUNTER — Other Ambulatory Visit: Payer: Self-pay

## 2021-07-10 ENCOUNTER — Encounter: Payer: Self-pay | Admitting: Urology

## 2021-07-10 VITALS — BP 130/76 | HR 60

## 2021-07-10 DIAGNOSIS — E291 Testicular hypofunction: Secondary | ICD-10-CM

## 2021-07-10 DIAGNOSIS — R350 Frequency of micturition: Secondary | ICD-10-CM | POA: Diagnosis not present

## 2021-07-10 DIAGNOSIS — N5201 Erectile dysfunction due to arterial insufficiency: Secondary | ICD-10-CM

## 2021-07-10 LAB — URINALYSIS, ROUTINE W REFLEX MICROSCOPIC
Bilirubin, UA: NEGATIVE
Glucose, UA: NEGATIVE
Ketones, UA: NEGATIVE
Leukocytes,UA: NEGATIVE
Nitrite, UA: NEGATIVE
Protein,UA: NEGATIVE
Specific Gravity, UA: >1.03 — ABNORMAL HIGH (ref 1.005–1.030)
Urobilinogen, Ur: 0.2 mg/dL (ref 0.2–1.0)
pH, UA: 5.5 (ref 5.0–7.5)

## 2021-07-10 LAB — MICROSCOPIC EXAMINATION
Bacteria, UA: NONE SEEN
Epithelial Cells (non renal): NONE SEEN /hpf (ref 0–10)
Renal Epithel, UA: NONE SEEN /hpf
WBC, UA: NONE SEEN /hpf (ref 0–5)

## 2021-07-10 LAB — BLADDER SCAN AMB NON-IMAGING: Scan Result: 1

## 2021-07-10 MED ORDER — TESTOSTERONE CYPIONATE 200 MG/ML IM SOLN
100.0000 mg | INTRAMUSCULAR | 3 refills | Status: DC
Start: 2021-07-10 — End: 2022-08-25

## 2021-07-10 MED ORDER — SILDENAFIL CITRATE 100 MG PO TABS: 100.0000 mg | ORAL_TABLET | Freq: Every day | ORAL | 5 refills | Status: DC | PRN

## 2021-07-10 NOTE — Patient Instructions (Signed)
Erectile Dysfunction °Erectile dysfunction (ED) is the inability to get or keep an erection in order to have sexual intercourse. ED is considered a symptom of an underlying disorder and is not considered a disease. ED may include: °Inability to get an erection. °Lack of enough hardness of the erection to allow penetration. °Loss of erection before sex is finished. °What are the causes? °This condition may be caused by: °Physical causes, such as: °Artery problems. This may include heart disease, high blood pressure, atherosclerosis, and diabetes. °Hormonal problems, such as low testosterone. °Obesity. °Nerve problems. This may include back or pelvic injuries, multiple sclerosis, Parkinson's disease, spinal cord injury, and stroke. °Certain medicines, such as: °Pain relievers. °Antidepressants. °Blood pressure medicines and water pills (diuretics). °Cancer medicines. °Antihistamines. °Muscle relaxants. °Lifestyle factors, such as: °Use of drugs such as marijuana, cocaine, or opioids. °Excessive use of alcohol. °Smoking. °Lack of physical activity or exercise. °Psychological causes, such as: °Anxiety or stress. °Sadness or depression. °Exhaustion. °Fear about sexual performance. °Guilt. °What are the signs or symptoms? °Symptoms of this condition include: °Inability to get an erection. °Lack of enough hardness of the erection to allow penetration. °Loss of the erection before sex is finished. °Sometimes having normal erections, but with frequent unsatisfactory episodes. °Low sexual satisfaction in either partner due to erection problems. °A curved penis occurring with erection. The curve may cause pain, or the penis may be too curved to allow for intercourse. °Never having nighttime or morning erections. °How is this diagnosed? °This condition is often diagnosed by: °Performing a physical exam to find other diseases or specific problems with the penis. °Asking you detailed questions about the problem. °Doing tests,  such as: °Blood tests to check for diabetes mellitus or high cholesterol, or to measure hormone levels. °Other tests to check for underlying health conditions. °An ultrasound exam to check for scarring. °A test to check blood flow to the penis. °Doing a sleep study at home to measure nighttime erections. °How is this treated? °This condition may be treated by: °Medicines, such as: °Medicine taken by mouth to help you achieve an erection (oral medicine). °Hormone replacement therapy to replace low testosterone levels. °Medicine that is injected into the penis. Your health care provider may instruct you how to give yourself these injections at home. °Medicine that is delivered with a short applicator tube. The tube is inserted into the opening at the tip of the penis, which is the opening of the urethra. A tiny pellet of medicine is put in the urethra. The pellet dissolves and enhances erectile function. This is also called MUSE (medicated urethral system for erections) therapy. °Vacuum pump. This is a pump with a ring on it. The pump and ring are placed on the penis and used to create pressure that helps the penis become erect. °Penile implant surgery. In this procedure, you may receive: °An inflatable implant. This consists of cylinders, a pump, and a reservoir. The cylinders can be inflated with a fluid that helps to create an erection, and they can be deflated after intercourse. °A semi-rigid implant. This consists of two silicone rubber rods. The rods provide some rigidity. They are also flexible, so the penis can both curve downward in its normal position and become straight for sexual intercourse. °Blood vessel surgery to improve blood flow to the penis. During this procedure, a blood vessel from a different part of the body is placed into the penis to allow blood to flow around (bypass) damaged or blocked blood vessels. °Lifestyle changes,   such as exercising more, losing weight, and quitting smoking. °Follow  these instructions at home: °Medicines ° °Take over-the-counter and prescription medicines only as told by your health care provider. Do not increase the dosage without first discussing it with your health care provider. °If you are using self-injections, do injections as directed by your health care provider. Make sure you avoid any veins that are on the surface of the penis. After giving an injection, apply pressure to the injection site for 5 minutes. °Talk to your health care provider about how to prevent headaches while taking ED medicines. These medicines may cause a sudden headache due to the increase in blood flow in your body. °General instructions °Exercise regularly, as directed by your health care provider. Work with your health care provider to lose weight, if needed. °Do not use any products that contain nicotine or tobacco. These products include cigarettes, chewing tobacco, and vaping devices, such as e-cigarettes. If you need help quitting, ask your health care provider. °Before using a vacuum pump, read the instructions that come with the pump and discuss any questions with your health care provider. °Keep all follow-up visits. This is important. °Contact a health care provider if: °You feel nauseous. °You are vomiting. °You get sudden headaches while taking ED medicines. °You have any concerns about your sexual health. °Get help right away if: °You are taking oral or injectable medicines and you have an erection that lasts longer than 4 hours. If your health care provider is unavailable, go to the nearest emergency room for evaluation. An erection that lasts much longer than 4 hours can result in permanent damage to your penis. °You have severe pain in your groin or abdomen. °You develop redness or severe swelling of your penis. °You have redness spreading at your groin or lower abdomen. °You are unable to urinate. °You experience chest pain or a rapid heartbeat (palpitations) after taking oral  medicines. °These symptoms may represent a serious problem that is an emergency. Do not wait to see if the symptoms will go away. Get medical help right away. Call your local emergency services (911 in the [place] Do not drive yourself to the hospital. °Summary °Erectile dysfunction (ED) is the inability to get or keep an erection during sexual intercourse. °This condition is diagnosed based on a physical exam, your symptoms, and tests to determine the cause. Treatment varies depending on the cause and may include medicines, hormone therapy, surgery, or a vacuum pump. °You may need follow-up visits to make sure that you are using your medicines or devices correctly. °Get help right away if you are taking or injecting medicines and you have an erection that lasts longer than 4 hours. °This information is not intended to replace advice given to you by your health care provider. Make sure you discuss any questions you have with your health care provider. °Document Revised: 10/31/2020 Document Reviewed: 10/31/2020 °Elsevier Patient Education © 2022 [employer] ° °

## 2021-07-10 NOTE — Progress Notes (Signed)
07/10/2021 9:30 AM   Michael Mills 07-21-1951 [id number]  Referring provider: Rosita Fire, MD [place],  [place] [id number]  Followup erectile dysfunction and hypogonadism   HPI: [name] is a 70yo here for followup for erectile dysfunction and hypogonadism.  No recent testosterone labs. He injects 100mg  IM testosterone every 14 days Good energy, good libido. He has stable erectile dysfunction and takes 50-75mg  sildenafil.     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 If you have any questions, ask your nurse or doctor.          B-D INTEGRA SYRINGE 22G X 1-1/2" 3 ML Misc Generic drug: SYRINGE-NEEDLE (DISP) 3 ML To be used for Testosterone inj   B-D 3CC LUER-LOK SYR 22GX1" 22G X 1" 3 ML Misc Generic drug: SYRINGE-NEEDLE (DISP) 3 ML   carvedilol 12.5 MG tablet Commonly known as: COREG   hydrochlorothiazide 25 MG tablet Commonly known as: HYDRODIURIL   pregabalin 75 MG capsule Commonly known as: LYRICA   sildenafil 100 MG tablet Commonly known as: VIAGRA Take 1 tablet (100 mg total) by mouth daily as needed for erectile dysfunction.   terbinafine 250 MG tablet Commonly known as: LamISIL Take 1 tablet (250 mg total) by mouth daily.   testosterone cypionate 200 MG/ML injection Commonly known as: DEPOTESTOSTERONE CYPIONATE INJECT 0.5 MLS (100 MG TOTAL) INTO THE MUSCLE EVERY 14  (FOURTEEN) DAYS.   tobramycin-dexamethasone ophthalmic solution Commonly known as: TOBRADEX   Vitamin D (Ergocalciferol) 1.25 MG (50000 UNIT) Caps capsule Commonly known as: DRISDOL TAKE 1 CAPSULE (50,000 UNITS TOTAL) BY MOUTH EVERY 7 (SEVEN) DAYS.        Allergies: No Known Allergies  Family History: Family History  Problem Relation Age of Onset   Heart disease Father    Diabetes Mellitus II Father    Cancer Mother     Social History:  reports that he has never smoked. He has never used smokeless tobacco. He reports that he does not drink alcohol and does not use drugs.  ROS: All other review of systems were reviewed and are negative except what is noted above in HPI  Physical Exam: BP 130/76   Pulse 60   Constitutional:  Alert and oriented, No acute distress. HEENT:  AT, moist mucus membranes.  Trachea midline, no masses. Cardiovascular: No clubbing, cyanosis, or edema. Respiratory: Normal respiratory effort, no increased work of breathing. GI: Abdomen is soft, nontender, nondistended, no abdominal masses GU: No CVA tenderness.  Lymph: No cervical or inguinal lymphadenopathy. Skin: No rashes, bruises or suspicious lesions. Neurologic: Grossly intact, no focal deficits, moving all 4 extremities. Psychiatric: Normal mood and affect.  Laboratory Data: Lab Results  Component Value Date   WBC 2.7 (L) 07/24/2020  HGB 16.4 07/24/2020   HCT 48.3 07/24/2020   MCV 91 07/24/2020   PLT 164 07/24/2020    Lab Results  Component Value Date   CREATININE 0.99 07/24/2020    No results found for: PSA  Lab Results  Component Value Date   TESTOSTERONE 224 (L) 08/16/2020    No results found for: HGBA1C  Urinalysis    Component Value Date/Time   APPEARANCEUR Clear 05/09/2020 0925   GLUCOSEU Negative 05/09/2020 0925   BILIRUBINUR Negative 05/09/2020 0925   PROTEINUR Negative 05/09/2020 0925   NITRITE Negative 05/09/2020 0925   LEUKOCYTESUR Negative 05/09/2020  0925    Lab Results  Component Value Date   LABMICR See below: 05/09/2020   WBCUA None seen 05/09/2020   LABEPIT None seen 05/09/2020   BACTERIA None seen 05/09/2020    Pertinent Imaging:  No results found for this or any previous visit.  No results found for this or any previous visit.  No results found for this or any previous visit.  No results found for this or any previous visit.  No results found for this or any previous visit.  No results found for this or any previous visit.  No results found for this or any previous visit.  No results found for this or any previous visit.   Assessment & Plan:    1. Erectile dysfunction due to arterial insufficiency Continue sildenafil 50-75mg  prn  2. Hypogonadism male -Testosterone labs today -Contineu 100mg  IM testosterone every 14 days  3. Urinary frequency -Patient defers therapy at this time - BLADDER SCAN AMB NON-IMAGING   No follow-ups on file.  Nicolette Bang, MD  [facility] [place]

## 2021-07-10 NOTE — Progress Notes (Signed)
post void residual=1  Urological Symptom Review  Patient is experiencing the following symptoms: Frequent urination Hard to postpone urination Get up at night to urinate Erection problems (male only)   Review of Systems  Gastrointestinal (upper)  : Negative for upper GI symptoms  Gastrointestinal (lower) : Constipation  Constitutional : Negative for symptoms  Skin: Negative for skin symptoms  Eyes: Negative for eye symptoms  Ear/Nose/Throat : Negative for Ear/Nose/Throat symptoms  Hematologic/Lymphatic: Negative for Hematologic/Lymphatic symptoms  Cardiovascular : Negative for cardiovascular symptoms  Respiratory : Negative for respiratory symptoms  Endocrine: Negative for endocrine symptoms  Musculoskeletal: Negative for musculoskeletal symptoms  Neurological: Negative for neurological symptoms  Psychologic: Negative for psychiatric symptoms

## 2021-07-10 NOTE — Addendum Note (Signed)
Addended by: Iris Pert on: 07/10/2021 12:58 PM   Modules accepted: Orders

## 2021-07-11 LAB — CBC WITH DIFFERENTIAL
Basophils Absolute: 0 10*3/uL (ref 0.0–0.2)
Basos: 1 %
EOS (ABSOLUTE): 0 10*3/uL (ref 0.0–0.4)
Eos: 1 %
Hematocrit: 49 % (ref 37.5–51.0)
Hemoglobin: 16.7 g/dL (ref 13.0–17.7)
Immature Grans (Abs): 0 10*3/uL (ref 0.0–0.1)
Immature Granulocytes: 0 %
Lymphocytes Absolute: 1.4 10*3/uL (ref 0.7–3.1)
Lymphs: 53 %
MCH: 30 pg (ref 26.6–33.0)
MCHC: 34.1 g/dL (ref 31.5–35.7)
MCV: 88 fL (ref 79–97)
Monocytes Absolute: 0.4 10*3/uL (ref 0.1–0.9)
Monocytes: 15 %
Neutrophils Absolute: 0.8 10*3/uL — ABNORMAL LOW (ref 1.4–7.0)
Neutrophils: 30 %
RBC: 5.56 x10E6/uL (ref 4.14–5.80)
RDW: 12.6 % (ref 11.6–15.4)
WBC: 2.7 10*3/uL — ABNORMAL LOW (ref 3.4–10.8)

## 2021-07-11 LAB — COMPREHENSIVE METABOLIC PANEL
ALT: 14 IU/L (ref 0–44)
AST: 18 IU/L (ref 0–40)
Albumin/Globulin Ratio: 2.2 (ref 1.2–2.2)
Albumin: 4.8 g/dL — ABNORMAL HIGH (ref 3.7–4.7)
Alkaline Phosphatase: 62 IU/L (ref 44–121)
BUN/Creatinine Ratio: 10 (ref 10–24)
BUN: 13 mg/dL (ref 8–27)
Bilirubin Total: 0.7 mg/dL (ref 0.0–1.2)
CO2: 24 mmol/L (ref 20–29)
Calcium: 9.6 mg/dL (ref 8.6–10.2)
Chloride: 100 mmol/L (ref 96–106)
Creatinine, Ser: 1.32 mg/dL — ABNORMAL HIGH (ref 0.76–1.27)
Globulin, Total: 2.2 g/dL (ref 1.5–4.5)
Glucose: 87 mg/dL (ref 70–99)
Potassium: 4.9 mmol/L (ref 3.5–5.2)
Sodium: 138 mmol/L (ref 134–144)
Total Protein: 7 g/dL (ref 6.0–8.5)
eGFR: 57 mL/min/{1.73_m2} — ABNORMAL LOW (ref >59)

## 2021-07-11 LAB — PSA: Prostate Specific Ag, Serum: 2 ng/mL (ref 0.0–4.0)

## 2021-07-15 LAB — TESTOSTERONE,FREE AND TOTAL
Testosterone, Free: 2.3 pg/mL — ABNORMAL LOW (ref 6.6–18.1)
Testosterone: 146 ng/dL — ABNORMAL LOW (ref 264–916)

## 2021-07-30 NOTE — Progress Notes (Signed)
Results mailed 

## 2021-08-02 DIAGNOSIS — I10 Essential (primary) hypertension: Secondary | ICD-10-CM | POA: Diagnosis not present

## 2021-08-02 DIAGNOSIS — E785 Hyperlipidemia, unspecified: Secondary | ICD-10-CM | POA: Diagnosis not present

## 2021-08-07 DIAGNOSIS — E236 Other disorders of pituitary gland: Secondary | ICD-10-CM | POA: Diagnosis not present

## 2021-08-07 DIAGNOSIS — E291 Testicular hypofunction: Secondary | ICD-10-CM | POA: Diagnosis not present

## 2021-08-09 LAB — TESTOSTERONE, FREE, TOTAL, SHBG
Sex Hormone Binding: 40.1 nmol/L (ref 19.3–76.4)
Testosterone, Free: 5.9 pg/mL — ABNORMAL LOW (ref 6.6–18.1)
Testosterone: 325 ng/dL (ref 264–916)

## 2021-08-09 LAB — T4, FREE: Free T4: 1.22 ng/dL (ref 0.82–1.77)

## 2021-08-09 LAB — PROLACTIN: Prolactin: 8.3 ng/mL (ref 4.0–15.2)

## 2021-08-09 LAB — TSH: TSH: 3.62 u[IU]/mL (ref 0.450–4.500)

## 2021-08-22 ENCOUNTER — Other Ambulatory Visit: Payer: Self-pay

## 2021-08-22 ENCOUNTER — Encounter: Payer: Self-pay | Admitting: "Endocrinology

## 2021-08-22 ENCOUNTER — Ambulatory Visit: Payer: Medicare HMO | Admitting: "Endocrinology

## 2021-08-22 VITALS — BP 126/86 | HR 72 | Ht 69.0 in | Wt 189.0 lb

## 2021-08-22 DIAGNOSIS — Z86018 Personal history of other benign neoplasm: Secondary | ICD-10-CM | POA: Diagnosis not present

## 2021-08-22 DIAGNOSIS — E236 Other disorders of pituitary gland: Secondary | ICD-10-CM

## 2021-08-22 NOTE — Progress Notes (Signed)
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[place])    Sleep apnea    Stomach ulcer    Vitamin D deficiency    Past Surgical History:  Procedure Laterality Date   ADRENALECTOMY  2005   [place] BIOPSY  2017   Social History   Socioeconomic History   Marital status: Married    Spouse name: Not on file   Number of children: 2   Years of education: Not on file   Highest education level: Not on file  Occupational History   Not on file  Tobacco Use   Smoking status: Never   Smokeless tobacco: Never  Substance and Sexual Activity   Alcohol use: Never   Drug use: Never   Sexual activity: Not on file  Other Topics Concern   Not on file  Social History Narrative   Not on file   Social Determinants of Health   Financial Resource Strain: Not on file  Food Insecurity: Not on file  Transportation Needs: Not on file  Physical Activity: Not on file  Stress: Not on file  Social Connections: Not on file   Family History  Problem Relation Age of Onset   Heart disease Father    Diabetes Mellitus II Father    Cancer Mother    Outpatient Encounter Medications as of 08/22/2021  Medication Sig   B-D 3CC LUER-LOK SYR 22GX1" 22G X 1" 3 ML MISC    carvedilol (COREG) 12.5 MG tablet    hydrochlorothiazide (HYDRODIURIL) 25 MG tablet    pregabalin (LYRICA) 75 MG capsule    sildenafil (VIAGRA) 100 MG tablet Take 1 tablet (100 mg total) by mouth daily as needed for erectile dysfunction.   SYRINGE-NEEDLE, DISP, 3 ML (B-D INTEGRA SYRINGE) 22G X 1-1/2" 3 ML MISC To be  used for Testosterone inj   terbinafine (LAMISIL) 250 MG tablet Take 1 tablet (250 mg total) by mouth daily.   testosterone cypionate (DEPOTESTOSTERONE CYPIONATE) 200 MG/ML injection Inject 0.5 mLs (100 mg total) into the muscle every 14 (fourteen) days.   tobramycin-dexamethasone (TOBRADEX) ophthalmic solution    Vitamin D, Ergocalciferol, (DRISDOL) 1.25 MG (50000 UNIT) CAPS capsule TAKE 1 CAPSULE (50,000 UNITS TOTAL) BY MOUTH EVERY 7 (SEVEN) DAYS.   No facility-administered encounter medications on file as of 08/22/2021.   ALLERGIES: No Known Allergies  VACCINATION STATUS:  There is no immunization history on file for this patient.  HPI Michael Mills is 71 y.o. male who presents today for follow-up after he was seen in consultation  due to his history of  adrenal adenoma s/p resection in 2009 , with subsequent endocrine work-up being in still in normal ranges.      He has moved from [place] to the area recently.  See  notes from his last visit.  This consult is from  Rosita Fire, MD.  he is on ongoing treatment with testosterone by his local urologist.  His most recent total testosterone is 325.     He also has documented history of pituitary cyst on MRI studies done in 2017 and 2019 with no interval change.  A CT scan of brain performed in December 2020 did not show any changes or acute intracranial abnormality.  Patient does not have any new complaints. He denies headaches, visual field loss/deficit. During this visit, he complains of occasional dizziness. His most recent thyroid function tests as well as a.m. cortisol are within normal limits.  He is currently being treated for hypertension on a stable regimen of Coreg and hydrochlorothiazide as well as vitamin D deficiency on 50,000 units of vitamin D2 weekly. -He is on Lyrica for postherpetic neuralgia.  Review of Systems   Objective:    Vitals with BMI 08/22/2021 07/10/2021 08/22/2020  Height $Remov'5\' 9"'fujoqR$  - $'5\' 9"'g$   Weight 189 lbs - 199  lbs 6 oz  BMI 79.4 - 80.16  Systolic 553 748 270  Diastolic 86 76 79  Pulse 72 60 60    BP 126/86    Pulse 72    Ht $R'5\' 9"'VE$  (1.753 m)    Wt 189 lb (85.7 kg)    BMI 27.91 kg/m   Wt Readings from Last 3 Encounters:  08/22/21 189 lb (85.7 kg)  08/22/20 199 lb 6.4 oz (90.4 kg)  05/21/20 196 lb (88.9 kg)    Physical Exam    CMP ( most recent) CMP     Component Value Date/Time   NA 138 07/10/2021 1654   K 4.9 07/10/2021 1654   CL 100 07/10/2021 1654   CO2 24 07/10/2021 1654   GLUCOSE 87 07/10/2021 1654   BUN 13 07/10/2021 1654   CREATININE 1.32 (H) 07/10/2021 1654   CALCIUM 9.6 07/10/2021 1654   PROT 7.0 07/10/2021 1654   ALBUMIN 4.8 (H) 07/10/2021 1654   AST 18 07/10/2021 1654   ALT 14 07/10/2021 1654   ALKPHOS 62 07/10/2021 1654   BILITOT 0.7 07/10/2021 1654   GFRNONAA 76 07/24/2020 1016   GFRAA 88 07/24/2020 1016   Recent Results (from the past 2160 hour(s))  BLADDER SCAN AMB NON-IMAGING     Status: None   Collection Time: 07/10/21 12:42 PM  Result Value Ref Range   Scan Result 1   Urinalysis, Routine w reflex microscopic     Status: Abnormal   Collection Time: 07/10/21  1:27 PM  Result Value Ref Range   Specific Gravity, UA >1.030 (H) 1.005 - 1.030   pH, UA 5.5 5.0 - 7.5   Color, UA Yellow Yellow   Appearance Ur Clear Clear   Leukocytes,UA Negative Negative   Protein,UA Negative Negative/Trace   Glucose, UA Negative Negative   Ketones, UA Negative Negative   RBC, UA 2+ (A) Negative   Bilirubin, UA Negative Negative   Urobilinogen, Ur 0.2 0.2 - 1.0 mg/dL   Nitrite, UA Negative Negative   Microscopic Examination See below:   Microscopic Examination     Status: Abnormal   Collection Time: 07/10/21  1:27 PM   Urine  Result Value Ref Range   WBC, UA None seen 0 - 5 /hpf   RBC 3-10 (A) 0 - 2 /hpf   Epithelial Cells (non renal) None seen 0 - 10 /hpf   Renal Epithel, UA None seen None seen /hpf   Mucus,  UA Present Not Estab.   Bacteria, UA None seen None  seen/Few  Comprehensive metabolic panel     Status: Abnormal   Collection Time: 07/10/21  4:54 PM  Result Value Ref Range   Glucose 87 70 - 99 mg/dL   BUN 13 8 - 27 mg/dL   Creatinine, Ser 1.32 (H) 0.76 - 1.27 mg/dL   eGFR 57 (L) >59 mL/min/1.73   BUN/Creatinine Ratio 10 10 - 24   Sodium 138 134 - 144 mmol/L   Potassium 4.9 3.5 - 5.2 mmol/L   Chloride 100 96 - 106 mmol/L   CO2 24 20 - 29 mmol/L   Calcium 9.6 8.6 - 10.2 mg/dL   Total Protein 7.0 6.0 - 8.5 g/dL   Albumin 4.8 (H) 3.7 - 4.7 g/dL   Globulin, Total 2.2 1.5 - 4.5 g/dL   Albumin/Globulin Ratio 2.2 1.2 - 2.2   Bilirubin Total 0.7 0.0 - 1.2 mg/dL   Alkaline Phosphatase 62 44 - 121 IU/L   AST 18 0 - 40 IU/L   ALT 14 0 - 44 IU/L  CBC With differential/Platelet     Status: Abnormal   Collection Time: 07/10/21  4:54 PM  Result Value Ref Range   WBC 2.7 (L) 3.4 - 10.8 x10E3/uL   RBC 5.56 4.14 - 5.80 x10E6/uL   Hemoglobin 16.7 13.0 - 17.7 g/dL   Hematocrit 49.0 37.5 - 51.0 %   MCV 88 79 - 97 fL   MCH 30.0 26.6 - 33.0 pg   MCHC 34.1 31.5 - 35.7 g/dL   RDW 12.6 11.6 - 15.4 %   Neutrophils 30 Not Estab. %   Lymphs 53 Not Estab. %   Monocytes 15 Not Estab. %   Eos 1 Not Estab. %   Basos 1 Not Estab. %   Neutrophils Absolute 0.8 (L) 1.4 - 7.0 x10E3/uL   Lymphocytes Absolute 1.4 0.7 - 3.1 x10E3/uL   Monocytes Absolute 0.4 0.1 - 0.9 x10E3/uL   EOS (ABSOLUTE) 0.0 0.0 - 0.4 x10E3/uL   Basophils Absolute 0.0 0.0 - 0.2 x10E3/uL   Immature Granulocytes 0 Not Estab. %   Immature Grans (Abs) 0.0 0.0 - 0.1 x10E3/uL   Hematology Comments: Note:     Comment: Verified by microscopic examination.  PSA     Status: None   Collection Time: 07/10/21  4:54 PM  Result Value Ref Range   Prostate Specific Ag, Serum 2.0 0.0 - 4.0 ng/mL    Comment: Roche ECLIA methodology. According to the American Urological Association, Serum PSA should decrease and remain at undetectable levels after radical prostatectomy. The AUA defines biochemical  recurrence as an initial PSA value 0.2 ng/mL or greater followed by a subsequent confirmatory PSA value 0.2 ng/mL or greater. Values obtained with different assay methods or kits cannot be used interchangeably. Results cannot be interpreted as absolute evidence of the presence or absence of malignant disease.   Testosterone,Free and Total     Status: Abnormal   Collection Time: 07/10/21  4:55 PM  Result Value Ref Range   Testosterone 146 (L) 264 - 916 ng/dL    Comment: Adult male reference interval is based on a population of healthy nonobese males (BMI <30) between 88 and 61 years old. [place], [place] [phone]. PMID: [id number].    Testosterone, Free 2.3 (L) 6.6 - 18.1 pg/mL  Prolactin     Status: None   Collection Time: 08/07/21  8:05 AM  Result Value Ref Range   Prolactin 8.3 4.0 - 15.2  ng/mL  Testosterone, Free, Total, SHBG     Status: Abnormal   Collection Time: 08/07/21  8:05 AM  Result Value Ref Range   Testosterone 325 264 - 916 ng/dL    Comment: Adult male reference interval is based on a population of healthy nonobese males (BMI <30) between 36 and 66 years old. [place], [place] [phone]. PMID: [id number].    Testosterone, Free 5.9 (L) 6.6 - 18.1 pg/mL   Sex Hormone Binding 40.1 19.3 - 76.4 nmol/L  TSH     Status: None   Collection Time: 08/07/21  8:05 AM  Result Value Ref Range   TSH 3.620 0.450 - 4.500 uIU/mL  T4, free     Status: None   Collection Time: 08/07/21  8:05 AM  Result Value Ref Range   Free T4 1.22 0.82 - 1.77 ng/dL     MRI of pituitary/brain without contrast performed on August 20, 2017 showed stable left-sided benign appearing 7.5 x 4 mm cyst/cystic focus in the upper posterior left pituitary gland.   Assessment & Plan:   1. History of adrenal adenoma -He is status post adenoma resection in 2009 in [place]. The details of his surgery and reports are not available to review. His subsequent endocrine work-up was  unremarkable. He is not on adrenal  steroid replacement.  He has well-controlled hypertension on carvedilol and HCTZ.   he did not have recent abdominal imaging. He will be considered if necessary after his next visit.    2. Hypogonadism, male -He has taken testosterone supplement for several years. He obtains his prescriptions from his urologist. Most recent labs show total testosterone of 325,  appropriate target for him. I did not make any changes on this. He is advised to continue testosterone 100 mg IM every 14 days.  His PSA is 2 improving from 3.4.  He has appointment with his urologist in February 2022.  He is encouraged to keep his appointment. He will have total testosterone.  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 A 2020 CT scan of the head did not show any new findings.  His thyroid function, prolactin, a.m. cortisol, as well as IGF-I are all within normal limits.    4. Vitamin D deficiency Patient is status post treatment with vitamin D2 50,000 units weekly.  He is advised to maintain with over-the-counter vitamin D3 5000 units daily.    - he is advised to maintain close follow up with Rosita Fire, MD for primary care needs.   I spent 25 minutes in the care of the patient today including review of labs from Thyroid Function, CMP, and other relevant labs ; imaging/biopsy records (current and previous including abstractions from other facilities); face-to-face time discussing  his lab results and symptoms, medications doses, his options of short and long term treatment based on the latest standards of care / guidelines;   and documenting the encounter.  Michael Mills  participated in the discussions, expressed understanding, and voiced agreement with the above plans.  All questions were answered to his satisfaction. he is encouraged to contact clinic should he have any  questions or concerns prior to his return visit.    Follow up plan: Return in about 1 year (around 08/22/2022) for F/U with Pre-visit Labs.   Glade Lloyd, MD [facility] [address] [place], [place] [address] Phone: [phone]  Fax: [phone]     08/22/2021, 10:22 AM  This note was partially dictated with voice recognition software. Similar sounding words can be transcribed inadequately or may not  be corrected upon review.

## 2021-09-02 DIAGNOSIS — I10 Essential (primary) hypertension: Secondary | ICD-10-CM | POA: Diagnosis not present

## 2021-09-02 DIAGNOSIS — E291 Testicular hypofunction: Secondary | ICD-10-CM | POA: Diagnosis not present

## 2021-09-24 DIAGNOSIS — H903 Sensorineural hearing loss, bilateral: Secondary | ICD-10-CM | POA: Diagnosis not present

## 2021-10-01 ENCOUNTER — Encounter: Payer: Self-pay | Admitting: *Deleted

## 2021-10-02 DIAGNOSIS — E785 Hyperlipidemia, unspecified: Secondary | ICD-10-CM | POA: Diagnosis not present

## 2021-10-02 DIAGNOSIS — H9113 Presbycusis, bilateral: Secondary | ICD-10-CM | POA: Diagnosis not present

## 2021-10-02 DIAGNOSIS — I10 Essential (primary) hypertension: Secondary | ICD-10-CM | POA: Diagnosis not present

## 2021-10-02 DIAGNOSIS — K2100 Gastro-esophageal reflux disease with esophagitis, without bleeding: Secondary | ICD-10-CM | POA: Diagnosis not present

## 2021-10-30 DIAGNOSIS — I10 Essential (primary) hypertension: Secondary | ICD-10-CM | POA: Diagnosis not present

## 2021-10-30 DIAGNOSIS — E291 Testicular hypofunction: Secondary | ICD-10-CM | POA: Diagnosis not present

## 2021-11-03 ENCOUNTER — Encounter (HOSPITAL_COMMUNITY): Payer: Self-pay | Admitting: Emergency Medicine

## 2021-11-03 ENCOUNTER — Other Ambulatory Visit: Payer: Self-pay

## 2021-11-03 ENCOUNTER — Emergency Department (HOSPITAL_COMMUNITY): Payer: Medicare HMO

## 2021-11-03 ENCOUNTER — Emergency Department (HOSPITAL_COMMUNITY)
Admission: EM | Admit: 2021-11-03 | Discharge: 2021-11-04 | Disposition: A | Discharge status: Home / Self Care | Payer: Medicare HMO | Attending: Emergency Medicine | Admitting: Emergency Medicine

## 2021-11-03 DIAGNOSIS — S199XXA Unspecified injury of neck, initial encounter: Secondary | ICD-10-CM | POA: Diagnosis not present

## 2021-11-03 DIAGNOSIS — S59902A Unspecified injury of left elbow, initial encounter: Secondary | ICD-10-CM | POA: Diagnosis not present

## 2021-11-03 DIAGNOSIS — Z041 Encounter for examination and observation following transport accident: Secondary | ICD-10-CM | POA: Diagnosis not present

## 2021-11-03 DIAGNOSIS — M542 Cervicalgia: Secondary | ICD-10-CM

## 2021-11-03 DIAGNOSIS — S0990XA Unspecified injury of head, initial encounter: Secondary | ICD-10-CM | POA: Diagnosis not present

## 2021-11-03 DIAGNOSIS — M25522 Pain in left elbow: Secondary | ICD-10-CM

## 2021-11-03 DIAGNOSIS — Y92410 Unspecified street and highway as the place of occurrence of the external cause: Secondary | ICD-10-CM | POA: Insufficient documentation
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
## 2021-11-11 DIAGNOSIS — I10 Essential (primary) hypertension: Secondary | ICD-10-CM | POA: Diagnosis not present

## 2021-11-11 DIAGNOSIS — R102 Pelvic and perineal pain: Secondary | ICD-10-CM | POA: Diagnosis not present

## 2021-11-11 DIAGNOSIS — K2100 Gastro-esophageal reflux disease with esophagitis, without bleeding: Secondary | ICD-10-CM | POA: Diagnosis not present

## 2021-12-02 ENCOUNTER — Ambulatory Visit (INDEPENDENT_AMBULATORY_CARE_PROVIDER_SITE_OTHER): Payer: Self-pay | Admitting: *Deleted

## 2021-12-02 VITALS — Ht 69.0 in | Wt 165.0 lb

## 2021-12-02 DIAGNOSIS — Z86010 Personal history of colonic polyps: Secondary | ICD-10-CM

## 2021-12-02 NOTE — Progress Notes (Addendum)
Gastroenterology Pre-Procedure Review ? ?Request Date: 12/02/2021 ?Requesting Physician: Dr. Legrand Rams, Last TCS done 05/11/2019 by Dr. Julianne Rice, diverticulosis coli, 10 year recommended.   ? ?PATIENT REVIEW QUESTIONS: The patient responded to the following health history questions as indicated:   ? ?1. Diabetes Melitis: no ?2. Joint replacements in the past 12 months: no ?3. Major health problems in the past 3 months: no ?4. Has an artificial valve or MVP: no ?5. Has a defibrillator: no ?6. Has been advised in past to take antibiotics in advance of a procedure like teeth cleaning: no ?7. Family history of colon cancer: no  ?8. Alcohol Use: no ?9. Illicit drug Use: no ?10. History of sleep apnea: yes, CPAP  ?11. History of coronary artery or other vascular stents placed within the last 12 months: no ?12. History of any prior anesthesia complications: no ?13. Body mass index is 24.37 kg/m?. ?   ?MEDICATIONS & ALLERGIES:    ?Patient reports the following regarding taking any blood thinners:   ?Plavix? no ?Aspirin? no ?Coumadin? no ?Brilinta? no ?Xarelto? no ?Eliquis? no ?Pradaxa? no ?Savaysa? no ?Effient? no ? ?Patient confirms/reports the following medications:  ?Current Outpatient Medications  ?Medication Sig Dispense Refill  ? carvedilol (COREG) 12.5 MG tablet daily at 6 (six) AM.    ? hydrochlorothiazide (HYDRODIURIL) 25 MG tablet daily at 6 (six) AM.    ? pregabalin (LYRICA) 75 MG capsule daily at 6 (six) AM.    ? sildenafil (VIAGRA) 100 MG tablet Take 1 tablet (100 mg total) by mouth daily as needed for erectile dysfunction. 10 tablet 5  ? testosterone cypionate (DEPOTESTOSTERONE CYPIONATE) 200 MG/ML injection Inject 0.5 mLs (100 mg total) into the muscle every 14 (fourteen) days. 10 mL 3  ? tobramycin-dexamethasone (TOBRADEX) ophthalmic solution daily at 6 (six) AM.    ? Vitamin D, Ergocalciferol, (DRISDOL) 1.25 MG (50000 UNIT) CAPS capsule TAKE 1 CAPSULE (50,000 UNITS TOTAL) BY MOUTH EVERY 7 (SEVEN) DAYS. 12  capsule 0  ? ?No current facility-administered medications for this visit.  ? ? ?Patient confirms/reports the following allergies:  ?No Known Allergies ? ?No orders of the defined types were placed in this encounter. ? ? ?AUTHORIZATION INFORMATION ?Primary Insurance: [place],  [place] #: [id number],  Group #: [id number] ?Pre-Cert / Josem Kaufmann required:  ?Pre-Cert / Auth #:  ? ?SCHEDULE INFORMATION: ?Procedure has been scheduled as follows:  ?Date: , Time:   ?Location: [facility] with Dr. Abbey Chatters ? ?This Gastroenterology Pre-Precedure Review Form is being routed to the following provider(s): Neil Crouch, PA-C ?  ?

## 2021-12-02 NOTE — Progress Notes (Signed)
Faxed request for records to Dr. Oran Rein office f: [phone]. ?

## 2021-12-03 NOTE — Progress Notes (Signed)
Updated triage after receiving report today.  Routing to Neil Crouch, PA-C to review after receiving records.  Placed report in [place] box for review. ?

## 2021-12-03 NOTE — Progress Notes (Signed)
Ok to schedule. ASA 2. Needs BMET for diuretic. ?

## 2021-12-12 DIAGNOSIS — I10 Essential (primary) hypertension: Secondary | ICD-10-CM | POA: Diagnosis not present

## 2021-12-12 DIAGNOSIS — E291 Testicular hypofunction: Secondary | ICD-10-CM | POA: Diagnosis not present

## 2021-12-13 NOTE — Progress Notes (Signed)
Reviewed op note from [facility] [place] dated 05/11/19. Procedure done for change in bowels/constipation. Exam was complete to the cecum. Prep adequate. He had diverticulosis in the ascending colon to the sigmoid colon (scattered). Next colonoscopy in 10 years advised but at that time he would be 71 years old and based on current guidelines colonoscopy not necessary after age of 11.   ? ?It appears that he is not due for screening exam. If he is having any concerns/GI issues, anemia, etc we will be more than glad to make an ov and decide what is needed at that time.  ? ? ?

## 2021-12-16 NOTE — Progress Notes (Signed)
Pt denies any GI issues, anemia, etc.  Informed him that based on last colonoscopy that he is not due for 10 years.  He was also informed that based on current guidelines that no repeat is recommended after age 73.  He was advised to call us if he develops any GI concerns.  He voiced understanding. ?

## 2021-12-31 ENCOUNTER — Other Ambulatory Visit: Payer: Medicare HMO

## 2021-12-31 DIAGNOSIS — E291 Testicular hypofunction: Secondary | ICD-10-CM

## 2022-01-01 LAB — CBC WITH DIFFERENTIAL

## 2022-01-01 LAB — COMPREHENSIVE METABOLIC PANEL WITH GFR
ALT: 14 IU/L (ref 0–44)
AST: 15 IU/L (ref 0–40)
Albumin/Globulin Ratio: 1.9 (ref 1.2–2.2)
Albumin: 4.6 g/dL (ref 3.7–4.7)
Alkaline Phosphatase: 65 IU/L (ref 44–121)
BUN/Creatinine Ratio: 10 (ref 10–24)
BUN: 12 mg/dL (ref 8–27)
Bilirubin Total: 0.6 mg/dL (ref 0.0–1.2)
CO2: 21 mmol/L (ref 20–29)
Calcium: 9.3 mg/dL (ref 8.6–10.2)
Chloride: 101 mmol/L (ref 96–106)
Creatinine, Ser: 1.16 mg/dL (ref 0.76–1.27)
Globulin, Total: 2.4 g/dL (ref 1.5–4.5)
Glucose: 88 mg/dL (ref 70–99)
Potassium: 4.7 mmol/L (ref 3.5–5.2)
Sodium: 138 mmol/L (ref 134–144)
Total Protein: 7 g/dL (ref 6.0–8.5)
eGFR: 67 mL/min/1.73 (ref >59)

## 2022-01-01 LAB — TESTOSTERONE,FREE AND TOTAL
Testosterone, Free: 11.9 pg/mL (ref 6.6–18.1)
Testosterone: 725 ng/dL (ref 264–916)

## 2022-01-07 ENCOUNTER — Ambulatory Visit: Payer: Medicare HMO | Admitting: Urology

## 2022-01-11 DIAGNOSIS — E291 Testicular hypofunction: Secondary | ICD-10-CM | POA: Diagnosis not present

## 2022-01-11 DIAGNOSIS — I10 Essential (primary) hypertension: Secondary | ICD-10-CM | POA: Diagnosis not present

## 2022-02-11 DIAGNOSIS — I10 Essential (primary) hypertension: Secondary | ICD-10-CM | POA: Diagnosis not present

## 2022-02-11 DIAGNOSIS — E785 Hyperlipidemia, unspecified: Secondary | ICD-10-CM | POA: Diagnosis not present

## 2022-03-13 DIAGNOSIS — E785 Hyperlipidemia, unspecified: Secondary | ICD-10-CM | POA: Diagnosis not present

## 2022-03-13 DIAGNOSIS — I10 Essential (primary) hypertension: Secondary | ICD-10-CM | POA: Diagnosis not present

## 2022-03-18 DIAGNOSIS — Z0001 Encounter for general adult medical examination with abnormal findings: Secondary | ICD-10-CM | POA: Diagnosis not present

## 2022-03-18 DIAGNOSIS — I10 Essential (primary) hypertension: Secondary | ICD-10-CM | POA: Diagnosis not present

## 2022-03-18 DIAGNOSIS — Z79899 Other long term (current) drug therapy: Secondary | ICD-10-CM | POA: Diagnosis not present

## 2022-03-21 DIAGNOSIS — Z1389 Encounter for screening for other disorder: Secondary | ICD-10-CM | POA: Diagnosis not present

## 2022-03-21 DIAGNOSIS — I10 Essential (primary) hypertension: Secondary | ICD-10-CM | POA: Diagnosis not present

## 2022-03-21 DIAGNOSIS — Z23 Encounter for immunization: Secondary | ICD-10-CM | POA: Diagnosis not present

## 2022-03-21 DIAGNOSIS — Z0001 Encounter for general adult medical examination with abnormal findings: Secondary | ICD-10-CM | POA: Diagnosis not present

## 2022-03-21 DIAGNOSIS — K2100 Gastro-esophageal reflux disease with esophagitis, without bleeding: Secondary | ICD-10-CM | POA: Diagnosis not present

## 2022-03-21 DIAGNOSIS — E785 Hyperlipidemia, unspecified: Secondary | ICD-10-CM | POA: Diagnosis not present

## 2022-04-21 DIAGNOSIS — E785 Hyperlipidemia, unspecified: Secondary | ICD-10-CM | POA: Diagnosis not present

## 2022-04-21 DIAGNOSIS — I10 Essential (primary) hypertension: Secondary | ICD-10-CM | POA: Diagnosis not present
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
## 2022-05-21 DIAGNOSIS — E785 Hyperlipidemia, unspecified: Secondary | ICD-10-CM | POA: Diagnosis not present

## 2022-05-21 DIAGNOSIS — I10 Essential (primary) hypertension: Secondary | ICD-10-CM | POA: Diagnosis not present

## 2022-06-03 DIAGNOSIS — Z23 Encounter for immunization: Secondary | ICD-10-CM | POA: Diagnosis not present

## 2022-06-19 DIAGNOSIS — H5203 Hypermetropia, bilateral: Secondary | ICD-10-CM | POA: Diagnosis not present

## 2022-06-19 DIAGNOSIS — H2513 Age-related nuclear cataract, bilateral: Secondary | ICD-10-CM | POA: Diagnosis not present

## 2022-06-19 DIAGNOSIS — H02401 Unspecified ptosis of right eyelid: Secondary | ICD-10-CM | POA: Diagnosis not present

## 2022-06-19 DIAGNOSIS — H524 Presbyopia: Secondary | ICD-10-CM | POA: Diagnosis not present

## 2022-06-19 DIAGNOSIS — H52223 Regular astigmatism, bilateral: Secondary | ICD-10-CM | POA: Diagnosis not present

## 2022-06-21 DIAGNOSIS — I10 Essential (primary) hypertension: Secondary | ICD-10-CM | POA: Diagnosis not present

## 2022-06-21 DIAGNOSIS — E785 Hyperlipidemia, unspecified: Secondary | ICD-10-CM | POA: Diagnosis not present

## 2022-07-21 DIAGNOSIS — I10 Essential (primary) hypertension: Secondary | ICD-10-CM | POA: Diagnosis not present

## 2022-07-21 DIAGNOSIS — E785 Hyperlipidemia, unspecified: Secondary | ICD-10-CM | POA: Diagnosis not present

## 2022-08-19 ENCOUNTER — Other Ambulatory Visit: Payer: Medicare HMO

## 2022-08-19 DIAGNOSIS — E291 Testicular hypofunction: Secondary | ICD-10-CM | POA: Diagnosis not present

## 2022-08-20 LAB — COMPREHENSIVE METABOLIC PANEL
ALT: 20 IU/L (ref 0–44)
AST: 18 IU/L (ref 0–40)
Albumin/Globulin Ratio: 1.9 (ref 1.2–2.2)
Albumin: 4.6 g/dL (ref 3.8–4.8)
Alkaline Phosphatase: 75 IU/L (ref 44–121)
BUN/Creatinine Ratio: 11 (ref 10–24)
BUN: 15 mg/dL (ref 8–27)
Bilirubin Total: 0.7 mg/dL (ref 0.0–1.2)
CO2: 23 mmol/L (ref 20–29)
Calcium: 9.6 mg/dL (ref 8.6–10.2)
Chloride: 101 mmol/L (ref 96–106)
Creatinine, Ser: 1.34 mg/dL — ABNORMAL HIGH (ref 0.76–1.27)
Globulin, Total: 2.4 g/dL (ref 1.5–4.5)
Glucose: 92 mg/dL (ref 70–99)
Potassium: 5.1 mmol/L (ref 3.5–5.2)
Sodium: 139 mmol/L (ref 134–144)
Total Protein: 7 g/dL (ref 6.0–8.5)
eGFR: 56 mL/min/{1.73_m2} — ABNORMAL LOW (ref >59)

## 2022-08-20 LAB — CBC
Hematocrit: 49.2 % (ref 37.5–51.0)
Hemoglobin: 16 g/dL (ref 13.0–17.7)
MCH: 29.6 pg (ref 26.6–33.0)
MCHC: 32.5 g/dL (ref 31.5–35.7)
MCV: 91 fL (ref 79–97)
Platelets: 182 10*3/uL (ref 150–450)
RBC: 5.41 x10E6/uL (ref 4.14–5.80)
RDW: 13.2 % (ref 11.6–15.4)
WBC: 2.9 10*3/uL — ABNORMAL LOW (ref 3.4–10.8)

## 2022-08-20 LAB — PSA: Prostate Specific Ag, Serum: 1.5 ng/mL (ref 0.0–4.0)

## 2022-08-20 LAB — TESTOSTERONE: Testosterone: 199 ng/dL — ABNORMAL LOW (ref 264–916)

## 2022-08-21 DIAGNOSIS — E785 Hyperlipidemia, unspecified: Secondary | ICD-10-CM | POA: Diagnosis not present

## 2022-08-21 DIAGNOSIS — I10 Essential (primary) hypertension: Secondary | ICD-10-CM | POA: Diagnosis not present

## 2022-08-22 ENCOUNTER — Ambulatory Visit: Payer: Medicare HMO | Admitting: "Endocrinology

## 2022-08-25 ENCOUNTER — Encounter: Payer: Self-pay | Admitting: Urology

## 2022-08-25 ENCOUNTER — Ambulatory Visit: Payer: Medicare HMO | Admitting: Urology

## 2022-08-25 ENCOUNTER — Other Ambulatory Visit: Payer: Self-pay | Admitting: "Endocrinology

## 2022-08-25 VITALS — BP 138/82 | HR 64

## 2022-08-25 DIAGNOSIS — E291 Testicular hypofunction: Secondary | ICD-10-CM | POA: Diagnosis not present

## 2022-08-25 DIAGNOSIS — R350 Frequency of micturition: Secondary | ICD-10-CM | POA: Diagnosis not present

## 2022-08-25 DIAGNOSIS — N5201 Erectile dysfunction due to arterial insufficiency: Secondary | ICD-10-CM

## 2022-08-25 DIAGNOSIS — E236 Other disorders of pituitary gland: Secondary | ICD-10-CM

## 2022-08-25 DIAGNOSIS — Z86018 Personal history of other benign neoplasm: Secondary | ICD-10-CM

## 2022-08-25 MED ORDER — TESTOSTERONE CYPIONATE 200 MG/ML IM SOLN
100.0000 mg | INTRAMUSCULAR | 3 refills | Status: DC
Start: 2022-08-25 — End: 2023-02-23

## 2022-08-25 MED ORDER — SILDENAFIL CITRATE 100 MG PO TABS: 100.0000 mg | ORAL_TABLET | Freq: Every day | ORAL | 5 refills | Status: DC | PRN

## 2022-08-25 NOTE — Patient Instructions (Signed)

## 2022-08-25 NOTE — Progress Notes (Signed)
08/25/2022 11:11 AM   Michael Mills [birth year] [id number]  Referring provider: Carrolyn Meiers, MD [place],  [place] [address]  Followup hypogonadism, urinary frequency and ED   HPI: Michael Mills is a 72yo here for followup for hypogonadism, erectile dysfunction and urinary frequency. He has urge incontinent episodes daily. He injects '100mg'$  IM testosterone every 14 days. Testosterone 199, hemoglobin 16.0, PSA 1.5. Good energy, good libido. 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 If you have any questions, ask your nurse or doctor.          ascorbic acid 500 MG tablet Commonly known as: VITAMIN C Take 500 mg by mouth daily.   carvedilol 12.5 MG tablet Commonly known as: COREG daily at 6 (six) AM.   hydrochlorothiazide 25 MG tablet Commonly known as: HYDRODIURIL daily at 6 (six) AM.   pregabalin 75 MG capsule Commonly known as: LYRICA daily at 6 (six) AM.   sildenafil 100 MG tablet Commonly known as: VIAGRA Take 1 tablet (100 mg total) by mouth daily as needed for erectile dysfunction.   testosterone cypionate 200 MG/ML injection Commonly known as: DEPOTESTOSTERONE CYPIONATE Inject 0.5 mLs (100 mg total) into the muscle every 14 (fourteen) days.   tobramycin-dexamethasone ophthalmic solution Commonly known as: TOBRADEX daily  at 6 (six) AM.   Vitamin D (Ergocalciferol) 1.25 MG (50000 UNIT) Caps capsule Commonly known as: DRISDOL TAKE 1 CAPSULE (50,000 UNITS TOTAL) BY MOUTH EVERY 7 (SEVEN) DAYS.        Allergies: No Known Allergies  Family History: Family History  Problem Relation Age of Onset   Heart disease Father    Diabetes Mellitus II Father    Cancer Mother     Social History:  reports that he has never smoked. He has never used smokeless tobacco. He reports that he does not drink alcohol and does not use drugs.  ROS: All other review of systems were reviewed and are negative except what is noted above in HPI  Physical Exam: BP 138/82   Pulse 64   Constitutional:  Alert and oriented, No acute distress. HEENT: [place] AT, moist mucus membranes.  Trachea midline, no masses. Cardiovascular: No clubbing, cyanosis, or edema. Respiratory: Normal respiratory effort, no increased work of breathing. GI: Abdomen is soft, nontender, nondistended, no abdominal masses GU: No CVA tenderness.  Lymph: No cervical or inguinal lymphadenopathy. Skin: No rashes, bruises or suspicious lesions. Neurologic: Grossly intact, no focal deficits, moving all 4 extremities. Psychiatric: Normal mood and affect.  Laboratory Data: Lab Results  Component Value Date   WBC 2.9 (L) 08/19/2022   HGB 16.0 08/19/2022   HCT 49.2 08/19/2022   MCV 91 08/19/2022  PLT 182 08/19/2022    Lab Results  Component Value Date   CREATININE 1.34 (H) 08/19/2022    No results found for: "PSA"  Lab Results  Component Value Date   TESTOSTERONE 199 (L) 08/19/2022    No results found for: "HGBA1C"  Urinalysis    Component Value Date/Time   APPEARANCEUR Clear 07/10/2021 1327   GLUCOSEU Negative 07/10/2021 1327   BILIRUBINUR Negative 07/10/2021 1327   PROTEINUR Negative 07/10/2021 1327   NITRITE Negative 07/10/2021 1327   LEUKOCYTESUR Negative 07/10/2021 1327    Lab Results  Component Value Date   LABMICR See below:  07/10/2021   WBCUA None seen 07/10/2021   LABEPIT None seen 07/10/2021   MUCUS Present 07/10/2021   BACTERIA None seen 07/10/2021    Pertinent Imaging:  No results found for this or any previous visit.  No results found for this or any previous visit.  No results found for this or any previous visit.  No results found for this or any previous visit.  No results found for this or any previous visit.  No valid procedures specified. No results found for this or any previous visit.  No results found for this or any previous visit.   Assessment & Plan:    1. Erectile dysfunction due to arterial insufficiency -sildenafil '100mg'$  prn  2. Hypogonadism male -Continue IM testosterone '100mg'$  every 14 days -followup 6 months with testosterone labs  3. Urinary frequency and urgency -the patient defers therapy at this time   No follow-ups on file.  Nicolette Bang, MD  [facility] [place]

## 2022-08-26 DIAGNOSIS — E236 Other disorders of pituitary gland: Secondary | ICD-10-CM | POA: Diagnosis not present

## 2022-08-26 DIAGNOSIS — Z86018 Personal history of other benign neoplasm: Secondary | ICD-10-CM | POA: Diagnosis not present

## 2022-08-27 LAB — COMPREHENSIVE METABOLIC PANEL
ALT: 17 IU/L (ref 0–44)
AST: 15 IU/L (ref 0–40)
Albumin/Globulin Ratio: 1.7 (ref 1.2–2.2)
Albumin: 4.7 g/dL (ref 3.8–4.8)
Alkaline Phosphatase: 73 IU/L (ref 44–121)
BUN/Creatinine Ratio: 11 (ref 10–24)
BUN: 13 mg/dL (ref 8–27)
Bilirubin Total: 0.4 mg/dL (ref 0.0–1.2)
CO2: 23 mmol/L (ref 20–29)
Calcium: 9.5 mg/dL (ref 8.6–10.2)
Chloride: 104 mmol/L (ref 96–106)
Creatinine, Ser: 1.15 mg/dL (ref 0.76–1.27)
Globulin, Total: 2.7 g/dL (ref 1.5–4.5)
Glucose: 96 mg/dL (ref 70–99)
Potassium: 4.3 mmol/L (ref 3.5–5.2)
Sodium: 140 mmol/L (ref 134–144)
Total Protein: 7.4 g/dL (ref 6.0–8.5)
eGFR: 67 mL/min/{1.73_m2} (ref >59)

## 2022-08-27 LAB — T4, FREE: Free T4: 1.19 ng/dL (ref 0.82–1.77)

## 2022-08-27 LAB — PROLACTIN: Prolactin: 12.5 ng/mL (ref 3.6–25.2)

## 2022-08-27 LAB — TSH: TSH: 3.95 u[IU]/mL (ref 0.450–4.500)

## 2022-08-27 LAB — CORTISOL-AM, BLOOD: Cortisol - AM: 13.1 ug/dL (ref 6.2–19.4)

## 2022-09-01 ENCOUNTER — Encounter: Payer: Self-pay | Admitting: Urology

## 2022-09-02 ENCOUNTER — Ambulatory Visit: Payer: Medicare HMO | Admitting: "Endocrinology

## 2022-09-02 ENCOUNTER — Encounter: Payer: Self-pay | Admitting: "Endocrinology

## 2022-09-02 VITALS — BP 124/78 | HR 68 | Ht 69.0 in | Wt 189.2 lb

## 2022-09-02 DIAGNOSIS — E236 Other disorders of pituitary gland: Secondary | ICD-10-CM

## 2022-09-02 DIAGNOSIS — E559 Vitamin D deficiency, unspecified: Secondary | ICD-10-CM

## 2022-09-02 DIAGNOSIS — Z86018 Personal history of other benign neoplasm: Secondary | ICD-10-CM

## 2022-09-02 DIAGNOSIS — E291 Testicular hypofunction: Secondary | ICD-10-CM

## 2022-09-02 NOTE — Progress Notes (Signed)
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[place])    Sleep apnea    Stomach ulcer    Vitamin D deficiency    Past Surgical History:  Procedure Laterality Date   ADRENALECTOMY  2005   [place] BIOPSY  2017   Social History   Socioeconomic History   Marital status: Married    Spouse name: Not on file   Number of children: 2   Years of education: Not on file   Highest education level: Not on file  Occupational History   Not on file  Tobacco Use   Smoking status: Never   Smokeless tobacco: Never  Substance and Sexual Activity   Alcohol use: Never   Drug use: Never   Sexual activity: Not on file  Other Topics Concern   Not on file  Social History Narrative   Not on file   Social Determinants of Health   Financial Resource Strain: Not on file  Food Insecurity: Not on file  Transportation Needs: Not on file  Physical Activity: Not on file  Stress: Not on file  Social Connections: Not on file   Family History  Problem Relation Age of Onset   Heart disease Father    Diabetes Mellitus II Father    Cancer Mother    Outpatient Encounter Medications as of 09/02/2022  Medication Sig   carvedilol (COREG) 12.5 MG tablet daily at 6 (six) AM.   hydrochlorothiazide (HYDRODIURIL) 25 MG tablet daily at 6 (six) AM.   pregabalin (LYRICA) 75 MG capsule daily at 6 (six) AM.   sildenafil (VIAGRA) 100 MG tablet Take 1 tablet (100 mg total) by mouth daily as needed for erectile dysfunction.   testosterone cypionate (DEPOTESTOSTERONE CYPIONATE) 200 MG/ML  injection Inject 0.5 mLs (100 mg total) into the muscle every 14 (fourteen) days.   tobramycin-dexamethasone (TOBRADEX) ophthalmic solution daily at 6 (six) AM.   vitamin C (ASCORBIC ACID) 500 MG tablet Take 500 mg by mouth daily.   Vitamin D, Ergocalciferol, (DRISDOL) 1.25 MG (50000 UNIT) CAPS capsule TAKE 1 CAPSULE (50,000 UNITS TOTAL) BY MOUTH EVERY 7 (SEVEN) DAYS.   No facility-administered encounter medications on file as of 09/02/2022.   ALLERGIES: No Known Allergies  VACCINATION STATUS:  There is no immunization history on file for this patient.  HPI Michael Mills is 72 y.o. male who presents today for follow-up after he was seen in consultation  due to his history of  adrenal adenoma s/p resection in 2009 , with subsequent endocrine work-up being in still in normal ranges.      He has moved from [place] to the area recently.  See notes from his last visit.  This consult is from  Carrolyn Meiers, MD.  he is on  ongoing treatment with testosterone by his local urologist.  His most recent total testosterone is 199 2 days before his next injection.  He is on 100 mg testosterone IM every 14 days.        He also has documented history of pituitary cyst on MRI studies done in 2017 and 2019 with no interval change.  A CT scan of brain performed in December 2020 did not show any changes or acute intracranial abnormality.  He has no new complaints today.  He denies headaches, visual field loss/deficit. 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   He is currently being treated for hypertension on a stable regimen of Coreg and hydrochlorothiazide as well as vitamin D deficiency on 50,000 units of vitamin D2 weekly. -He is on Lyrica for postherpetic neuralgia.  Review of Systems   Objective:       09/02/2022    3:23 PM 08/25/2022   10:38 AM 12/02/2021   10:00 AM  Vitals with BMI  Height '5\' 9"'$   '5\' 9"'$   Weight 189 lbs 3 oz  165 lbs  BMI  00.71  21.97  Systolic 588 325   Diastolic 78 82   Pulse 68 64     BP 124/78   Pulse 68   Ht '5\' 9"'$  (1.753 m)   Wt 189 lb 3.2 oz (85.8 kg)   BMI 27.94 kg/m   Wt Readings from Last 3 Encounters:  09/02/22 189 lb 3.2 oz (85.8 kg)  12/02/21 165 lb (74.8 kg)  08/22/21 189 lb (85.7 kg)    Physical Exam    CMP ( most recent) CMP     Component Value Date/Time   NA 140 08/26/2022 0816   K 4.3 08/26/2022 0816   CL 104 08/26/2022 0816   CO2 23 08/26/2022 0816   GLUCOSE 96 08/26/2022 0816   BUN 13 08/26/2022 0816   CREATININE 1.15 08/26/2022 0816   CALCIUM 9.5 08/26/2022 0816   PROT 7.4 08/26/2022 0816   ALBUMIN 4.7 08/26/2022 0816   AST 15 08/26/2022 0816   ALT 17 08/26/2022 0816   ALKPHOS 73 08/26/2022 0816   BILITOT 0.4 08/26/2022 0816   GFRNONAA 76 07/24/2020 1016   GFRAA 88 07/24/2020 1016   Recent Results (from the past 2160 hour(s))  CBC     Status: Abnormal   Collection Time: 08/19/22  9:45 AM  Result Value Ref Range   WBC 2.9 (L) 3.4 - 10.8 x10E3/uL   RBC 5.41 4.14 - 5.80 x10E6/uL   Hemoglobin 16.0 13.0 - 17.7 g/dL   Hematocrit 49.2 37.5 - 51.0 %   MCV 91 79 - 97 fL   MCH 29.6 26.6 - 33.0 pg   MCHC 32.5 31.5 - 35.7 g/dL   RDW 13.2 11.6 - 15.4 %   Platelets 182 150 - 450 x10E3/uL  Testosterone     Status: Abnormal   Collection Time: 08/19/22  9:45 AM  Result Value Ref Range   Testosterone 199 (L) 264 - 916 ng/dL    Comment: Adult male reference interval is based on a population of healthy nonobese males (BMI <30) between 1 and 71 years old. [place], [place] [phone]. PMID: [id number].   PSA     Status: None   Collection Time: 08/19/22  9:45 AM  Result Value Ref Range   Prostate Specific Ag, Serum 1.5 0.0 - 4.0 ng/mL    Comment: Roche ECLIA methodology. According to the American Urological Association, Serum PSA should decrease and  remain at undetectable levels after radical prostatectomy. The AUA defines biochemical recurrence as an  initial PSA value 0.2 ng/mL or greater followed by a subsequent confirmatory PSA value 0.2 ng/mL or greater. Values obtained with different assay methods or kits cannot be used interchangeably. Results cannot be interpreted as absolute evidence of the presence or absence of malignant disease.   Comprehensive metabolic panel     Status: Abnormal   Collection Time: 08/19/22  9:45 AM  Result Value Ref Range   Glucose 92 70 - 99 mg/dL   BUN 15 8 - 27 mg/dL   Creatinine, Ser 1.34 (H) 0.76 - 1.27 mg/dL   eGFR 56 (L) >59 mL/min/1.73   BUN/Creatinine Ratio 11 10 - 24   Sodium 139 134 - 144 mmol/L   Potassium 5.1 3.5 - 5.2 mmol/L   Chloride 101 96 - 106 mmol/L   CO2 23 20 - 29 mmol/L   Calcium 9.6 8.6 - 10.2 mg/dL   Total Protein 7.0 6.0 - 8.5 g/dL   Albumin 4.6 3.8 - 4.8 g/dL   Globulin, Total 2.4 1.5 - 4.5 g/dL   Albumin/Globulin Ratio 1.9 1.2 - 2.2   Bilirubin Total 0.7 0.0 - 1.2 mg/dL   Alkaline Phosphatase 75 44 - 121 IU/L   AST 18 0 - 40 IU/L   ALT 20 0 - 44 IU/L  Comprehensive metabolic panel     Status: None   Collection Time: 08/26/22  8:16 AM  Result Value Ref Range   Glucose 96 70 - 99 mg/dL   BUN 13 8 - 27 mg/dL   Creatinine, Ser 1.15 0.76 - 1.27 mg/dL   eGFR 67 >59 mL/min/1.73   BUN/Creatinine Ratio 11 10 - 24   Sodium 140 134 - 144 mmol/L   Potassium 4.3 3.5 - 5.2 mmol/L   Chloride 104 96 - 106 mmol/L   CO2 23 20 - 29 mmol/L   Calcium 9.5 8.6 - 10.2 mg/dL   Total Protein 7.4 6.0 - 8.5 g/dL   Albumin 4.7 3.8 - 4.8 g/dL   Globulin, Total 2.7 1.5 - 4.5 g/dL   Albumin/Globulin Ratio 1.7 1.2 - 2.2   Bilirubin Total 0.4 0.0 - 1.2 mg/dL   Alkaline Phosphatase 73 44 - 121 IU/L   AST 15 0 - 40 IU/L   ALT 17 0 - 44 IU/L  Cortisol-am, blood     Status: None   Collection Time: 08/26/22  8:16 AM  Result Value Ref Range   Cortisol - AM 13.1 6.2 - 19.4 ug/dL  T4, Free     Status: None   Collection Time: 08/26/22  8:16 AM  Result Value Ref Range   Free T4 1.19 0.82 - 1.77  ng/dL  TSH     Status: None   Collection Time: 08/26/22  8:16 AM  Result Value Ref Range   TSH 3.950 0.450 - 4.500 uIU/mL  Prolactin     Status: None   Collection Time: 08/26/22  8:16 AM  Result Value Ref Range   Prolactin 12.5 3.6 - 25.2 ng/mL    Comment:               **Please note reference interval change**     MRI of pituitary/brain without contrast performed on August 20, 2017 showed stable left-sided benign appearing 7.5 x 4 mm cyst/cystic focus in the upper posterior left pituitary gland.   Assessment & Plan:   1. History of adrenal adenoma -He is status post adenoma resection in 2009  in [place]. The details of his surgery and reports are not available to review.  His subsequent endocrine workup remains unremarkable.   He is not on adrenal  steroid replacement.  He has well-controlled hypertension on carvedilol and HCTZ.   he did not have recent abdominal imaging. He will be considered if necessary after his next visit.    2. Hypogonadism, male -He has taken testosterone supplement for several years. He obtains his prescriptions from his urologist. Most recent labs show total testosterone of 199 2 days before his next injection.  This is considered  acceptable range for him.  I did not make any changes on this. He is advised to continue testosterone 100 mg IM every 14 days.  His PSA is 2 improving from 3.4.    He is encouraged to keep his appointment with his urologist.   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 A 2020 CT scan of the head did not show any new findings.  His thyroid function test, prolactin, a.m. cortisol as well as IGF-I have been documented to be within normal limits.  No further intervention needed at this time.   4. Vitamin D deficiency Patient is status post treatment with vitamin D2 50,000 units weekly.  He is advised to maintain with  over-the-counter vitamin D3 5000 units daily.    - he is advised to maintain close follow up with Carrolyn Meiers, MD for primary care needs.   I spent 21 minutes in the care of the patient today including review of labs from Thyroid Function, CMP, and other relevant labs ; imaging/biopsy records (current and previous including abstractions from other facilities); face-to-face time discussing  his lab results and symptoms, medications doses, his options of short and long term treatment based on the latest standards of care / guidelines;   and documenting the encounter.  Michael Mills  participated in the discussions, expressed understanding, and voiced agreement with the above plans.  All questions were answered to his satisfaction. he is encouraged to contact clinic should he have any questions or concerns prior to his return visit.     Follow up plan: Return in about 1 year (around 09/03/2023) for Fasting Labs  in AM B4 8.   Glade Lloyd, MD [facility] [address] [place], [place] [address] Phone: [phone]  Fax: [phone]     09/02/2022, 5:00 PM  This note was partially dictated with voice recognition software. Similar sounding words can be transcribed inadequately or may not  be corrected upon review.

## 2022-09-25 DIAGNOSIS — K2100 Gastro-esophageal reflux disease with esophagitis, without bleeding: Secondary | ICD-10-CM | POA: Diagnosis not present

## 2022-09-25 DIAGNOSIS — E785 Hyperlipidemia, unspecified: Secondary | ICD-10-CM | POA: Diagnosis not present

## 2022-09-25 DIAGNOSIS — E291 Testicular hypofunction: Secondary | ICD-10-CM | POA: Diagnosis not present

## 2022-09-25 DIAGNOSIS — E559 Vitamin D deficiency, unspecified: Secondary | ICD-10-CM | POA: Diagnosis not present

## 2022-09-25 DIAGNOSIS — Z86018 Personal history of other benign neoplasm: Secondary | ICD-10-CM | POA: Diagnosis not present

## 2022-09-25 DIAGNOSIS — I10 Essential (primary) hypertension: Secondary | ICD-10-CM | POA: Diagnosis not present

## 2022-10-23 DIAGNOSIS — M79672 Pain in left foot: Secondary | ICD-10-CM | POA: Diagnosis not present

## 2022-10-23 DIAGNOSIS — E559 Vitamin D deficiency, unspecified: Secondary | ICD-10-CM | POA: Diagnosis not present

## 2022-10-23 DIAGNOSIS — I10 Essential (primary) hypertension: Secondary | ICD-10-CM | POA: Diagnosis not present

## 2022-10-27 ENCOUNTER — Ambulatory Visit (HOSPITAL_COMMUNITY)
Admission: RE | Admit: 2022-10-27 | Discharge: 2022-10-27 | Disposition: A | Discharge status: Home / Self Care | Payer: Medicare HMO | Source: Ambulatory Visit | Attending: Gerontology | Admitting: Gerontology

## 2022-10-27 ENCOUNTER — Other Ambulatory Visit (HOSPITAL_COMMUNITY): Payer: Self-pay | Admitting: Gerontology

## 2022-10-27 DIAGNOSIS — M79672 Pain in left foot: Secondary | ICD-10-CM | POA: Insufficient documentation

## 2022-10-27 DIAGNOSIS — R200 Anesthesia of skin: Secondary | ICD-10-CM | POA: Diagnosis not present

## 2022-10-28 ENCOUNTER — Encounter: Payer: Self-pay | Admitting: Orthopaedic Surgery

## 2022-10-28 ENCOUNTER — Ambulatory Visit (INDEPENDENT_AMBULATORY_CARE_PROVIDER_SITE_OTHER): Payer: Medicare HMO | Admitting: Orthopaedic Surgery

## 2022-10-28 VITALS — BP 154/92 | HR 64 | Ht 69.0 in | Wt 189.0 lb

## 2022-10-28 DIAGNOSIS — M79672 Pain in left foot: Secondary | ICD-10-CM | POA: Diagnosis not present

## 2022-10-28 NOTE — Patient Instructions (Addendum)
 Aspercreme, Biofreeze, Blue Emu or Voltaren Gel over the counter 2-3 times daily. Rub into area well each use for best results.  DR.KEELING'S SCHEDULE IS AS FOLLOWS: TUESDAY: ALL DAY WEDNESDAY: MORNING ONLY THURSDAY: MORNING ONLY PLEASE CALL OR SEND A MESSAGE VIA MYCHART BY WEDNESDAY MORNING SO THAT I CAN SEND A REQUEST TO HIM. HE LEAVES THURSDAY BY 11:30AM. HE DOES NOT RETURN TO THE OFFICE UNTIL TUESDAY MORNINGS AND HE DOES NOT CHECK HIS WORK MESSAGES DURING HIS TIME AWAY FROM THE OFFICE. I'M  AND IF YOU NEED SOMETHING, SEND ME A MYCHART MESSAGE OR CALL. MYCHART IS THE FASTEST WAY TO GET IN CONTACT WITH ME.   TAKE ALEVE 1 TABLET BY MOUTH TWICE DAILY.  YOU CAN BUY THIS OVER THE COUNTER AT A PHARMACY. THE GENERIC NAME IS NAPROXEN

## 2022-10-28 NOTE — Progress Notes (Signed)
Subjective:    Patient ID: Michael Mills, male    DOB: [birth year], 72 y.o.   MRN: [id number]  HPI He has foot pain more on the left at the "ball" of his foot more towards the great toe and second toe.  It hurts when he walks but not sitting down or sleeping.  He has no swelling, no redness, no trauma.  He has not really done anything for it.  He has seen Dr. Legrand Rams and I have the notes and have reviewed them.  He has tried going to a size 10 shoe and he feels better.  He wears a 9.5 shoe normally.   Review of Systems  Constitutional:  Positive for activity change.  Musculoskeletal:  Positive for arthralgias and gait problem.  All other systems reviewed and are negative. For Review of Systems, all other systems reviewed and are negative.  The following is a summary of the past history medically, past history surgically, known current medicines, social history and family history.  This information is gathered electronically by the computer from prior information and documentation.  I review this each visit and have found including this information at this point in the chart is beneficial and informative.   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     hydrochlorothiazide (HYDRODIURIL) 25 MG tablet daily at 6 (six) AM.     pregabalin (LYRICA) 75 MG capsule daily at 6 (six) AM.     sildenafil (VIAGRA) 100 MG tablet Take 1 tablet (100 mg total) by mouth  daily as needed for erectile dysfunction. 10 tablet 5   testosterone cypionate (DEPOTESTOSTERONE CYPIONATE) 200 MG/ML injection Inject 0.5 mLs (100 mg total) into the muscle every 14 (fourteen) days. 10 mL 3   tobramycin-dexamethasone (TOBRADEX) ophthalmic solution daily at 6 (six) AM.     vitamin C (ASCORBIC ACID) 500 MG tablet Take 500 mg by mouth daily.     Vitamin D, Ergocalciferol, (DRISDOL) 1.25 MG (50000 UNIT) CAPS capsule TAKE 1 CAPSULE (50,000 UNITS TOTAL) BY MOUTH EVERY 7 (SEVEN) DAYS. 12 capsule 0   No current facility-administered medications on file prior to visit.    Social History   Socioeconomic History   Marital status: Married    Spouse name: Not on file   Number of children: 2   Years of education: Not on file   Highest education level: Not on file  Occupational History   Not on file  Tobacco Use   Smoking status: Never   Smokeless tobacco: Never  Substance and Sexual Activity   Alcohol use: Never   Drug use: Never   Sexual activity: Not on file  Other Topics Concern   Not on file  Social History Narrative   Not on file   Social Determinants of Health   Financial Resource Strain:  Not on file  Food Insecurity: Not on file  Transportation Needs: Not on file  Physical Activity: Not on file  Stress: Not on file  Social Connections: Not on file  Intimate Partner Violence: Not on file    Family History  Problem Relation Age of Onset   Heart disease Father    Diabetes Mellitus II Father    Cancer Mother     BP (!) 154/92   Pulse 64   Ht '5\' 9"'$  (1.753 m)   Wt 189 lb (85.7 kg)   BMI 27.91 kg/m   Body mass index is 27.91 kg/m.      Objective:   Physical Exam Vitals and nursing note reviewed. Exam conducted with a chaperone present.  Constitutional:      Appearance: He is well-developed.  HENT:     Head: Normocephalic and atraumatic.  Eyes:     Conjunctiva/sclera: Conjunctivae normal.     Pupils: Pupils are equal, round, and reactive to  light.  Cardiovascular:     Rate and Rhythm: Normal rate and regular rhythm.  Pulmonary:     Effort: Pulmonary effort is normal.  Abdominal:     Palpations: Abdomen is soft.  Musculoskeletal:     Cervical back: Normal range of motion and neck supple.       Feet:  Skin:    General: Skin is warm and dry.  Neurological:     Mental Status: He is alert and oriented to person, place, and time.     Cranial Nerves: No cranial nerve deficit.     Motor: No abnormal muscle tone.     Coordination: Coordination normal.     Deep Tendon Reflexes: Reflexes are normal and symmetric. Reflexes normal.  Psychiatric:        Behavior: Behavior normal.        Thought Content: Thought content normal.        Judgment: Judgment normal.   I have independently reviewed and interpreted x-rays of this patient done at another site by another physician or qualified health professional.         Assessment & Plan:   Encounter Diagnosis  Name Primary?   Left foot pain Yes   I have told him to first got and get his feet measured at a foot store to see if his foot size has increased.  I have told him to use one Aleve twice a day after eating.  I have told him to use Aspercreme, Biofreeze or Voltaren Gel to the area tid.  Return in two weeks.  Call if any problem.  Precautions discussed.  Electronically Signed Sanjuana Kava, MD 3/12/202410:49 AM

## 2022-11-11 ENCOUNTER — Ambulatory Visit: Payer: Medicare HMO | Admitting: Orthopaedic Surgery

## 2022-11-23 DIAGNOSIS — I10 Essential (primary) hypertension: Secondary | ICD-10-CM | POA: Diagnosis not present

## 2022-11-23 DIAGNOSIS — E785 Hyperlipidemia, unspecified: Secondary | ICD-10-CM | POA: Diagnosis not present

## 2022-12-23 DIAGNOSIS — E785 Hyperlipidemia, unspecified: Secondary | ICD-10-CM | POA: Diagnosis not present

## 2022-12-23 DIAGNOSIS — I10 Essential (primary) hypertension: Secondary | ICD-10-CM | POA: Diagnosis not present

## 2023-01-23 DIAGNOSIS — I10 Essential (primary) hypertension: Secondary | ICD-10-CM | POA: Diagnosis not present

## 2023-01-23 DIAGNOSIS — E785 Hyperlipidemia, unspecified: Secondary | ICD-10-CM | POA: Diagnosis not present

## 2023-02-16 ENCOUNTER — Other Ambulatory Visit: Payer: Medicare HMO

## 2023-02-22 DIAGNOSIS — I10 Essential (primary) hypertension: Secondary | ICD-10-CM | POA: Diagnosis not present

## 2023-02-22 DIAGNOSIS — E785 Hyperlipidemia, unspecified: Secondary | ICD-10-CM | POA: Diagnosis not present

## 2023-02-23 ENCOUNTER — Ambulatory Visit: Payer: Medicare HMO | Admitting: Urology

## 2023-02-23 VITALS — BP 155/86 | HR 51

## 2023-02-23 DIAGNOSIS — E291 Testicular hypofunction: Secondary | ICD-10-CM | POA: Diagnosis not present

## 2023-02-23 DIAGNOSIS — N5201 Erectile dysfunction due to arterial insufficiency: Secondary | ICD-10-CM

## 2023-02-23 MED ORDER — TADALAFIL 20 MG PO TABS: 20.0000 mg | ORAL_TABLET | Freq: Every day | ORAL | 5 refills | Status: DC | PRN

## 2023-02-23 MED ORDER — TESTOSTERONE CYPIONATE 200 MG/ML IM SOLN
100.0000 mg | INTRAMUSCULAR | 3 refills | Status: DC
Start: 2023-02-23 — End: 2023-09-24

## 2023-02-23 NOTE — Progress Notes (Unsigned)
02/23/2023 9:56 AM   Michael Mills 1951-06-28 [id number]  Referring provider: Benetta Spar, MD [address] [place],  [place] [address]  Followup hypogonadism and erectile dysfunction   HPI: Michael Mills is a 72yo here for followup for hypogonadism and erectile dysfunction. No recent testosterone labs. Good energy. Good libido. Sildeanfil 100mg  fails to give him a firm erection. No other complaints today  PMH: Past Medical History:  Diagnosis Date   Acid reflux    Acromegaly and pituitary gigantism (HCC)    Adrenal gland cancer (HCC)    Heart burn    High cholesterol    Hypertension    Pituitary gland disorder (HCC)    Sleep apnea    Stomach ulcer    Vitamin D deficiency     Surgical History: Past Surgical History:  Procedure Laterality Date   ADRENALECTOMY  2005   CHOLECYSTECTOMY     FACIAL RECONSTRUCTION SURGERY  1991   PROSTATE BIOPSY  2017    Home Medications:  Allergies as of 02/23/2023   No Known Allergies      Medication List        Accurate as of February 23, 2023  9:56 AM. If you have any questions, ask your nurse or doctor.          ascorbic acid 500 MG tablet Commonly known as: VITAMIN C Take 500 mg by mouth daily.   carvedilol 12.5 MG tablet Commonly known as: COREG daily at 6 (six) AM.   hydrochlorothiazide 25 MG tablet Commonly known as: HYDRODIURIL daily at 6 (six) AM.   lovastatin 20 MG tablet Commonly known as: MEVACOR Take 20 mg by mouth daily.   pregabalin 75 MG capsule Commonly known as: LYRICA daily at 6 (six) AM.   sildenafil 100 MG tablet Commonly known as: VIAGRA Take 1 tablet (100 mg total) by mouth daily as needed for erectile dysfunction.   testosterone cypionate 200 MG/ML injection Commonly known as: DEPOTESTOSTERONE CYPIONATE Inject 0.5 mLs (100 mg total) into the muscle every 14 (fourteen) days.   tobramycin-dexamethasone ophthalmic solution Commonly known as: TOBRADEX daily at 6 (six)  AM.   Vitamin D (Ergocalciferol) 1.25 MG (50000 UNIT) Caps capsule Commonly known as: DRISDOL TAKE 1 CAPSULE (50,000 UNITS TOTAL) BY MOUTH EVERY 7 (SEVEN) DAYS.        Allergies: No Known Allergies  Family History: Family History  Problem Relation Age of Onset   Heart disease Father    Diabetes Mellitus II Father    Cancer Mother     Social History:  reports that he has never smoked. He has never used smokeless tobacco. He reports that he does not drink alcohol and does not use drugs.  ROS: All other review of systems were reviewed and are negative except what is noted above in HPI  Physical Exam: BP (!) 155/86   Pulse (!) 51   Constitutional:  Alert and oriented, No acute distress. HEENT: [place] AT, moist mucus membranes.  Trachea midline, no masses. Cardiovascular: No clubbing, cyanosis, or edema. Respiratory: Normal respiratory effort, no increased work of breathing. GI: Abdomen is soft, nontender, nondistended, no abdominal masses GU: No CVA tenderness.  Lymph: No cervical or inguinal lymphadenopathy. Skin: No rashes, bruises or suspicious lesions. Neurologic: Grossly intact, no focal deficits, moving all 4 extremities. Psychiatric: Normal mood and affect.  Laboratory Data: Lab Results  Component Value Date   WBC 2.9 (L) 08/19/2022   HGB 16.0 08/19/2022   HCT 49.2 08/19/2022  MCV 91 08/19/2022   PLT 182 08/19/2022    Lab Results  Component Value Date   CREATININE 1.15 08/26/2022    No results found for: "PSA"  Lab Results  Component Value Date   TESTOSTERONE 199 (L) 08/19/2022    No results found for: "HGBA1C"  Urinalysis    Component Value Date/Time   APPEARANCEUR Clear 07/10/2021 1327   GLUCOSEU Negative 07/10/2021 1327   BILIRUBINUR Negative 07/10/2021 1327   PROTEINUR Negative 07/10/2021 1327   NITRITE Negative 07/10/2021 1327   LEUKOCYTESUR Negative 07/10/2021 1327    Lab Results  Component Value Date   LABMICR See below: 07/10/2021    WBCUA None seen 07/10/2021   LABEPIT None seen 07/10/2021   MUCUS Present 07/10/2021   BACTERIA None seen 07/10/2021    Pertinent Imaging:  No results found for this or any previous visit.  No results found for this or any previous visit.  No results found for this or any previous visit.  No results found for this or any previous visit.  No results found for this or any previous visit.  No valid procedures specified. No results found for this or any previous visit.  No results found for this or any previous visit.   Assessment & Plan:    1. Hypogonadism male Testosterone labs today -continue 100mg  every 2 weeks -followup 6 months with labs  2. Erectile dysfunction due to arterial insufficiency -we will trial tadalafil 20mg  prn   No follow-ups on file.  Wilkie Aye, MD  [facility] [place]

## 2023-02-24 ENCOUNTER — Encounter: Payer: Self-pay | Admitting: Urology

## 2023-02-24 NOTE — Patient Instructions (Signed)

## 2023-02-25 LAB — CBC
Hematocrit: 49.3 % (ref 37.5–51.0)
Hemoglobin: 16.4 g/dL (ref 13.0–17.7)
MCH: 29.8 pg (ref 26.6–33.0)
MCHC: 33.3 g/dL (ref 31.5–35.7)
MCV: 90 fL (ref 79–97)
Platelets: 196 10*3/uL (ref 150–450)
RBC: 5.5 x10E6/uL (ref 4.14–5.80)
RDW: 13.6 % (ref 11.6–15.4)
WBC: 2.9 10*3/uL — ABNORMAL LOW (ref 3.4–10.8)

## 2023-02-25 LAB — COMPREHENSIVE METABOLIC PANEL
ALT: 16 IU/L (ref 0–44)
AST: 20 IU/L (ref 0–40)
Albumin: 4.8 g/dL (ref 3.8–4.8)
Alkaline Phosphatase: 63 IU/L (ref 44–121)
BUN/Creatinine Ratio: 11 (ref 10–24)
BUN: 13 mg/dL (ref 8–27)
Bilirubin Total: 0.8 mg/dL (ref 0.0–1.2)
CO2: 24 mmol/L (ref 20–29)
Calcium: 9.7 mg/dL (ref 8.6–10.2)
Chloride: 100 mmol/L (ref 96–106)
Creatinine, Ser: 1.2 mg/dL (ref 0.76–1.27)
Globulin, Total: 2.5 g/dL (ref 1.5–4.5)
Glucose: 97 mg/dL (ref 70–99)
Potassium: 5.2 mmol/L (ref 3.5–5.2)
Sodium: 138 mmol/L (ref 134–144)
Total Protein: 7.3 g/dL (ref 6.0–8.5)
eGFR: 63 mL/min/{1.73_m2} (ref >59)

## 2023-02-25 LAB — TESTOSTERONE,FREE AND TOTAL
Testosterone, Free: 17.2 pg/mL (ref 6.6–18.1)
Testosterone: >1500 ng/dL — ABNORMAL HIGH (ref 264–916)

## 2023-02-25 LAB — PSA: Prostate Specific Ag, Serum: 1.8 ng/mL (ref 0.0–4.0)

## 2023-03-02 ENCOUNTER — Telehealth: Payer: Self-pay

## 2023-03-02 NOTE — Telephone Encounter (Signed)
Covermymeds key [id number] Pending tadalafil

## 2023-03-02 NOTE — Telephone Encounter (Signed)
-----   Message from Nurse Jill Side sent at 03/02/2023  9:28 AM EDT ----- Medication pa request

## 2023-03-03 ENCOUNTER — Telehealth: Payer: Self-pay

## 2023-03-03 DIAGNOSIS — I10 Essential (primary) hypertension: Secondary | ICD-10-CM | POA: Diagnosis not present

## 2023-03-03 DIAGNOSIS — N401 Enlarged prostate with lower urinary tract symptoms: Secondary | ICD-10-CM | POA: Diagnosis not present

## 2023-03-03 DIAGNOSIS — M542 Cervicalgia: Secondary | ICD-10-CM | POA: Diagnosis not present

## 2023-03-03 NOTE — Telephone Encounter (Signed)
Patient is requesting you review his recent lab work from 07/08

## 2023-03-03 NOTE — Telephone Encounter (Signed)
Patient needing to discuss most recent lab results.  Please advise.

## 2023-03-04 ENCOUNTER — Other Ambulatory Visit (HOSPITAL_COMMUNITY): Payer: Self-pay | Admitting: Gerontology

## 2023-03-04 ENCOUNTER — Ambulatory Visit (HOSPITAL_COMMUNITY)
Admission: RE | Admit: 2023-03-04 | Discharge: 2023-03-04 | Disposition: A | Discharge status: Home / Self Care | Payer: Medicare HMO | Source: Ambulatory Visit | Attending: Gerontology | Admitting: Gerontology

## 2023-03-04 DIAGNOSIS — M542 Cervicalgia: Secondary | ICD-10-CM | POA: Insufficient documentation

## 2023-03-04 DIAGNOSIS — M47812 Spondylosis without myelopathy or radiculopathy, cervical region: Secondary | ICD-10-CM | POA: Diagnosis not present

## 2023-03-04 DIAGNOSIS — M9931 Osseous stenosis of neural canal of cervical region: Secondary | ICD-10-CM | POA: Diagnosis not present

## 2023-03-04 NOTE — Telephone Encounter (Signed)
Medication approval received for Tadalafil. Scanned into media.

## 2023-03-17 NOTE — Telephone Encounter (Signed)
Patient came by office to discuss most recent lab work.  Needing a call back to know results as soon as possible.  Call: : [phone]

## 2023-03-18 NOTE — Telephone Encounter (Signed)
Tried calling patient with no answer and unable to leave voiced message due to voice mail full. Per Dr. Ronne Binning labs was normal and Testerone levels was reading high possibly due to labs be drawn close to last injection.

## 2023-03-24 ENCOUNTER — Encounter: Payer: Self-pay | Admitting: Orthopaedic Surgery

## 2023-03-24 ENCOUNTER — Ambulatory Visit: Payer: Medicare HMO | Admitting: Orthopaedic Surgery

## 2023-03-24 VITALS — BP 135/87 | HR 50 | Ht 69.0 in | Wt 187.0 lb

## 2023-03-24 DIAGNOSIS — M542 Cervicalgia: Secondary | ICD-10-CM

## 2023-03-24 NOTE — Patient Instructions (Signed)
While we are working on your approval for MRI please go ahead and call to schedule your appointment with [facility] within at least one (1) week.   Central Scheduling [phone]  

## 2023-03-24 NOTE — Progress Notes (Signed)
My neck hurts.  He has had increasing pain in his neck over the last few months.  He has no new trauma.  He has pain and decreased motion of his neck. He has seen his family doctor and had cervical spine X-rays done recently on July 17.  He is not improving.  He is taking ibuprofen without help.  He has pain turning to the right.  He has no weakness, no paresthesia.  ROM of the neck is good but limited to the right.  NV and DTRs intact.  No spasm.  Grips are good.  Muscle tone and strength normal.  I have independently reviewed and interpreted x-rays of this patient done at another site by another physician or qualified health professional.  I have reviewed his other notes.  Encounter Diagnosis  Name Primary?   Cervical pain Yes   I will get MRI of the cervical spine.  Return in one month.  Continue present medicine.  Call if any problem.  Precautions discussed.  Electronically Signed Darreld Mclean, MD 8/6/202410:34 AM

## 2023-04-07 DIAGNOSIS — Z0001 Encounter for general adult medical examination with abnormal findings: Secondary | ICD-10-CM | POA: Diagnosis not present

## 2023-04-07 DIAGNOSIS — K2100 Gastro-esophageal reflux disease with esophagitis, without bleeding: Secondary | ICD-10-CM | POA: Diagnosis not present

## 2023-04-07 DIAGNOSIS — Z1389 Encounter for screening for other disorder: Secondary | ICD-10-CM | POA: Diagnosis not present

## 2023-04-07 DIAGNOSIS — I10 Essential (primary) hypertension: Secondary | ICD-10-CM | POA: Diagnosis not present

## 2023-04-07 DIAGNOSIS — M542 Cervicalgia: Secondary | ICD-10-CM | POA: Diagnosis not present

## 2023-04-07 DIAGNOSIS — E785 Hyperlipidemia, unspecified: Secondary | ICD-10-CM | POA: Diagnosis not present

## 2023-04-07 DIAGNOSIS — Z1331 Encounter for screening for depression: Secondary | ICD-10-CM | POA: Diagnosis not present

## 2023-04-07 DIAGNOSIS — N401 Enlarged prostate with lower urinary tract symptoms: Secondary | ICD-10-CM | POA: Diagnosis not present

## 2023-04-08 ENCOUNTER — Telehealth: Payer: Self-pay | Admitting: Orthopaedic Surgery

## 2023-04-08 DIAGNOSIS — E785 Hyperlipidemia, unspecified: Secondary | ICD-10-CM | POA: Diagnosis not present

## 2023-04-08 DIAGNOSIS — I10 Essential (primary) hypertension: Secondary | ICD-10-CM | POA: Diagnosis not present

## 2023-04-08 DIAGNOSIS — E559 Vitamin D deficiency, unspecified: Secondary | ICD-10-CM | POA: Diagnosis not present

## 2023-04-08 DIAGNOSIS — Z23 Encounter for immunization: Secondary | ICD-10-CM | POA: Diagnosis not present

## 2023-04-08 DIAGNOSIS — N401 Enlarged prostate with lower urinary tract symptoms: Secondary | ICD-10-CM | POA: Diagnosis not present

## 2023-04-08 DIAGNOSIS — E291 Testicular hypofunction: Secondary | ICD-10-CM | POA: Diagnosis not present

## 2023-04-08 NOTE — Telephone Encounter (Signed)
Dr. Sanjuan Dame pt - spoke w/Melanie [phone] at the pre-service center, she stated that this pt is having a MRI 8/23 and it requires a pre authorization.

## 2023-04-10 ENCOUNTER — Ambulatory Visit (HOSPITAL_COMMUNITY)
Admission: RE | Admit: 2023-04-10 | Discharge: 2023-04-10 | Disposition: A | Discharge status: Home / Self Care | Payer: Medicare HMO | Source: Ambulatory Visit | Attending: Orthopaedic Surgery | Admitting: Orthopaedic Surgery

## 2023-04-10 DIAGNOSIS — M4802 Spinal stenosis, cervical region: Secondary | ICD-10-CM | POA: Diagnosis not present

## 2023-04-10 DIAGNOSIS — M47812 Spondylosis without myelopathy or radiculopathy, cervical region: Secondary | ICD-10-CM | POA: Diagnosis not present

## 2023-04-10 DIAGNOSIS — M542 Cervicalgia: Secondary | ICD-10-CM | POA: Diagnosis not present

## 2023-04-10 DIAGNOSIS — M50223 Other cervical disc displacement at C6-C7 level: Secondary | ICD-10-CM | POA: Diagnosis not present

## 2023-04-10 DIAGNOSIS — M47811 Spondylosis without myelopathy or radiculopathy, occipito-atlanto-axial region: Secondary | ICD-10-CM | POA: Diagnosis not present

## 2023-04-21 ENCOUNTER — Ambulatory Visit: Payer: Medicare HMO | Admitting: Orthopaedic Surgery

## 2023-04-21 ENCOUNTER — Encounter: Payer: Self-pay | Admitting: Orthopaedic Surgery

## 2023-04-21 VITALS — BP 127/79 | HR 53 | Ht 69.0 in | Wt 187.0 lb

## 2023-04-21 DIAGNOSIS — M542 Cervicalgia: Secondary | ICD-10-CM | POA: Diagnosis not present

## 2023-04-21 NOTE — Addendum Note (Signed)
Addended by: Michaele Offer on: 04/21/2023 11:54 AM   Modules accepted: Orders

## 2023-04-21 NOTE — Progress Notes (Signed)
My neck hurts.  He had MRI of the cervical spine showing: IMPRESSION: 1. Multilevel degenerative changes of the cervical spine, most notably at C3-C4 where there is moderate to severe right-sided neural foraminal narrowing secondary to a combination of degenerative facet disease and uncovertebral hypertrophy. 2. No evidence of high-grade spinal canal stenosis  I have explained the findings to him.  I will have neurosurgery see him for possible epidural injections.  I have independently reviewed the MRI.    He has neck pain but no spasm.  ROM is full.  NV intact. Grips normal.  Encounter Diagnosis  Name Primary?   Cervical pain Yes   To neurosurgery.  Call if any problem.  Precautions discussed.  Return here in one month.  Electronically Signed Darreld Mclean, MD 9/3/20249:17 AM

## 2023-04-28 ENCOUNTER — Encounter: Payer: Self-pay | Admitting: Urology

## 2023-04-28 ENCOUNTER — Ambulatory Visit: Payer: Medicare HMO | Admitting: Urology

## 2023-04-28 VITALS — BP 144/84 | HR 55 | Temp 98.0°F

## 2023-04-28 DIAGNOSIS — N4889 Other specified disorders of penis: Secondary | ICD-10-CM

## 2023-04-28 DIAGNOSIS — N5201 Erectile dysfunction due to arterial insufficiency: Secondary | ICD-10-CM | POA: Diagnosis not present

## 2023-04-28 DIAGNOSIS — R300 Dysuria: Secondary | ICD-10-CM | POA: Diagnosis not present

## 2023-04-28 DIAGNOSIS — R3129 Other microscopic hematuria: Secondary | ICD-10-CM

## 2023-04-28 NOTE — Progress Notes (Signed)
post void residual=3

## 2023-04-28 NOTE — Progress Notes (Signed)
Name: Michael Mills DOB: June 15, 1951 MRN: [id number]  History of Present Illness: Michael Mills is a 72 y.o. male who presents today for follow up visit at [facility] [place]. - GU history: 1. BPH. 2. Erectile dysfunction. 3. Hypogonadism.   At last visit with Michael Mills on 02/23/2023: - Reported that Sildenafil 100 mg fails to give him a firm erection.  - The plan was: 1. Labs. Testosterone was high (>1500); per Michael Mills reading possibly high due to close timing from last injection. PSA normal (1.8). 2. Continue testosterone replacement therapy with 100 mg every 2 weeks. 3. Trial Tadalafil 20 mg PRN. 4. Follow up in 6 months with labs.   Today: He reports that over the past 1 month he has been having intermittent episodes of pain at the tip of his penis which is described a mild dull ache which lasts for less than a minute. He states this occurs about 1-2x/week. Denies any accompanying urinary symptoms - no increased urinary urgency, frequency, nocturia, dysuria, gross hematuria, hesitancy, straining to void, or sensations of incomplete emptying. Denies fevers, nausea, abdominal pain, flank pain, or rectal pain.  Denies history of GU stones, radiation, cancer, UTIs. Has never smoked.   Fall Screening: Do you usually have a device to assist in your mobility? No   Medications: Current Outpatient Medications  Medication Sig Dispense Refill   carvedilol (COREG) 12.5 MG tablet daily at 6 (six) AM.     hydrochlorothiazide (HYDRODIURIL) 25 MG tablet daily at 6 (six) AM.     lovastatin (MEVACOR) 20 MG tablet Take 20 mg by mouth daily.     pregabalin (LYRICA) 75 MG capsule daily at 6 (six) AM.     sildenafil (VIAGRA) 100 MG tablet Take 1 tablet (100 mg total) by mouth daily as needed for erectile dysfunction. 10 tablet 5   tadalafil (CIALIS) 20 MG tablet Take 1 tablet (20 mg total) by mouth daily as needed. 30 tablet 5   testosterone cypionate (DEPOTESTOSTERONE CYPIONATE)  200 MG/ML injection Inject 0.5 mLs (100 mg total) into the muscle every 14 (fourteen) days. 10 mL 3   tobramycin-dexamethasone (TOBRADEX) ophthalmic solution daily at 6 (six) AM.     vitamin C (ASCORBIC ACID) 500 MG tablet Take 500 mg by mouth daily.     Vitamin D, Ergocalciferol, (DRISDOL) 1.25 MG (50000 UNIT) CAPS capsule TAKE 1 CAPSULE (50,000 UNITS TOTAL) BY MOUTH EVERY 7 (SEVEN) DAYS. 12 capsule 0   No current facility-administered medications for this visit.    Allergies: No Known Allergies  Past Medical History:  Diagnosis Date   Acid reflux    Acromegaly and pituitary gigantism (HCC)    Adrenal gland cancer (HCC)    Heart burn    High cholesterol    Hypertension    Pituitary gland disorder (HCC)    Sleep apnea    Stomach ulcer    Vitamin D deficiency    Past Surgical History:  Procedure Laterality Date   ADRENALECTOMY  2005   CHOLECYSTECTOMY     FACIAL RECONSTRUCTION SURGERY  1991   PROSTATE BIOPSY  2017   Family History  Problem Relation Age of Onset   Heart disease Father    Diabetes Mellitus II Father    Cancer Mother    Social History   Socioeconomic History   Marital status: Married    Spouse name: Not on file   Number of children: 2   Years of education: Not on file   Highest education  level: Not on file  Occupational History   Not on file  Tobacco Use   Smoking status: Never   Smokeless tobacco: Never  Substance and Sexual Activity   Alcohol use: Never   Drug use: Never   Sexual activity: Not on file  Other Topics Concern   Not on file  Social History Narrative   Not on file   Social Determinants of Health   Financial Resource Strain: Not on file  Food Insecurity: Not on file  Transportation Needs: Not on file  Physical Activity: Not on file  Stress: Not on file  Social Connections: Not on file  Intimate Partner Violence: Not on file    Review of Systems Constitutional: Patient denies any unintentional weight loss or change in  strength lntegumentary: Patient denies any rashes or pruritus Cardiovascular: Patient denies chest pain or syncope Respiratory: Patient denies shortness of breath Gastrointestinal: Patient denies nausea, vomiting, constipation, or diarrhea Musculoskeletal: Patient denies muscle cramps or weakness Neurologic: Patient denies convulsions or seizures Psychiatric: Patient denies memory problems Allergic/Immunologic: Patient denies recent allergic reaction(s) Hematologic/Lymphatic: Patient denies bleeding tendencies Endocrine: Patient denies heat/cold intolerance  GU: As per HPI.  OBJECTIVE Vitals:   04/28/23 1504 04/28/23 1505  BP: (!) 136/90 (!) 144/84  Pulse: (!) 59 (!) 55  Temp:  98 F (36.7 C)   There is no height or weight on file to calculate BMI.  Physical Examination Constitutional: No obvious distress; patient is non-toxic appearing  Cardiovascular: No visible lower extremity edema.  Respiratory: The patient does not have audible wheezing/stridor; respirations do not appear labored  Gastrointestinal: Abdomen non-distended Musculoskeletal: Normal ROM of UEs  Skin: No obvious rashes/open sores  Neurologic: CN 2-12 grossly intact Psychiatric: Answered questions appropriately with normal affect  Hematologic/Lymphatic/Immunologic: No obvious bruises or sites of spontaneous bleeding  UA: positive for microscopic hematuria with no evidence of infection  ASSESSMENT Penile pain - Plan: Urinalysis, Routine w reflex microscopic, BLADDER SCAN AMB NON-IMAGING, CT ABDOMEN PELVIS W WO CONTRAST  Microscopic hematuria - Plan: CT ABDOMEN PELVIS W WO CONTRAST  We reviewed history and discussed suspected neuropathic pain being referred from bladder to penis. Has microscopic hematuria. Advised further evaluation via CT urogram and cystoscopy.   Pt decided to pursue this work-up and follow-up afterward to discuss the results and formulate a treatment plan based on the findings. All  questions were answered.  PLAN Advised the following: 1. CT. 2. Return for 1st available cystoscopy with any urology MD.  Orders Placed This Encounter  Procedures   CT ABDOMEN PELVIS W WO CONTRAST    Standing Status:   Future    Standing Expiration Date:   04/27/2024    Order Specific Question:   If indicated for the ordered procedure, I authorize the administration of contrast media per Radiology protocol    Answer:   Yes    Order Specific Question:   Does the patient have a contrast media/X-ray dye allergy?    Answer:   No    Order Specific Question:   Preferred imaging location?    Answer:   [facility]    Order Specific Question:   If indicated for the ordered procedure, I authorize the administration of oral contrast media per Radiology protocol    Answer:   Yes   Urinalysis, Routine w reflex microscopic   BLADDER SCAN AMB NON-IMAGING    It has been explained that the patient is to follow regularly with their PCP in addition to all other  providers involved in their care and to follow instructions provided by these respective offices. Patient advised to contact urology clinic if any urologic-pertaining questions, concerns, new symptoms or problems arise in the interim period.  There are no Patient Instructions on file for this visit.  Electronically signed by:  Donnita Falls, FNP   04/28/23    5:16 PM

## 2023-04-29 ENCOUNTER — Other Ambulatory Visit: Payer: Medicare HMO | Admitting: Urology

## 2023-04-29 LAB — URINALYSIS, ROUTINE W REFLEX MICROSCOPIC
Bilirubin, UA: NEGATIVE
Glucose, UA: NEGATIVE
Ketones, UA: NEGATIVE
Leukocytes,UA: NEGATIVE
Nitrite, UA: NEGATIVE
Protein,UA: NEGATIVE
Specific Gravity, UA: 1.02 (ref 1.005–1.030)
Urobilinogen, Ur: 0.2 mg/dL (ref 0.2–1.0)
pH, UA: 6 (ref 5.0–7.5)

## 2023-04-29 LAB — MICROSCOPIC EXAMINATION
Bacteria, UA: NONE SEEN
WBC, UA: NONE SEEN /HPF (ref 0–5)

## 2023-05-04 ENCOUNTER — Telehealth: Payer: Self-pay

## 2023-05-04 NOTE — Telephone Encounter (Signed)
Patient left a voice message 05-04-2023.  Needing Rx refill of syringes for Testerone shots.  Please advise as soon as possible.

## 2023-05-05 ENCOUNTER — Other Ambulatory Visit: Payer: Self-pay

## 2023-05-05 MED ORDER — "SYRINGE/NEEDLE (DISP) 22G X 1"" 3 ML MISC": 12 refills | Status: DC

## 2023-05-05 MED ORDER — "NEEDLE (DISP) 18G X 1"" MISC": 12 refills | Status: DC

## 2023-05-05 NOTE — Telephone Encounter (Signed)
Rx for needles and syringe sent to local [facility] pharmacy listed in patient chart.

## 2023-05-05 NOTE — Telephone Encounter (Signed)
Patient called said he was told that needles were not called in , I verified that they were sent to [facility] [address]

## 2023-05-08 DIAGNOSIS — I10 Essential (primary) hypertension: Secondary | ICD-10-CM | POA: Diagnosis not present

## 2023-05-08 DIAGNOSIS — E785 Hyperlipidemia, unspecified: Secondary | ICD-10-CM | POA: Diagnosis not present

## 2023-05-18 ENCOUNTER — Encounter (HOSPITAL_COMMUNITY): Payer: Self-pay | Admitting: Radiology

## 2023-05-18 ENCOUNTER — Ambulatory Visit (HOSPITAL_COMMUNITY)
Admission: RE | Admit: 2023-05-18 | Discharge: 2023-05-18 | Disposition: A | Discharge status: Home / Self Care | Payer: Medicare HMO | Source: Ambulatory Visit | Attending: Urology | Admitting: Urology

## 2023-05-18 DIAGNOSIS — N4889 Other specified disorders of penis: Secondary | ICD-10-CM | POA: Insufficient documentation

## 2023-05-18 DIAGNOSIS — R319 Hematuria, unspecified: Secondary | ICD-10-CM | POA: Diagnosis not present

## 2023-05-18 DIAGNOSIS — K7689 Other specified diseases of liver: Secondary | ICD-10-CM | POA: Diagnosis not present

## 2023-05-18 DIAGNOSIS — K429 Umbilical hernia without obstruction or gangrene: Secondary | ICD-10-CM | POA: Diagnosis not present

## 2023-05-18 DIAGNOSIS — R3129 Other microscopic hematuria: Secondary | ICD-10-CM | POA: Insufficient documentation

## 2023-05-18 LAB — POCT I-STAT CREATININE: Creatinine, Ser: 1.5 mg/dL — ABNORMAL HIGH (ref 0.61–1.24)

## 2023-05-18 MED ORDER — IOHEXOL 300 MG/ML  SOLN
100.0000 mL | Freq: Once | INTRAMUSCULAR | Status: AC | PRN
  Administered 2023-05-18: 100 mL via INTRAVENOUS

## 2023-05-19 ENCOUNTER — Ambulatory Visit: Payer: Medicare HMO | Admitting: Orthopaedic Surgery

## 2023-05-19 ENCOUNTER — Encounter: Payer: Self-pay | Admitting: Orthopaedic Surgery

## 2023-05-19 VITALS — BP 130/81 | HR 62

## 2023-05-19 DIAGNOSIS — M542 Cervicalgia: Secondary | ICD-10-CM

## 2023-05-19 NOTE — Progress Notes (Signed)
I am better.  He has had less pain in the neck area over the last few weeks.  He has used a rub that works well for him.  He has appointment to see the neurosurgeon tomorrow but does not feel anything needs to be done as he is so much better.  He has no weakness and just an occasional pain.  ROM of the neck is full, NV intact.  Encounter Diagnosis  Name Primary?   Cervical pain Yes   I will see as needed.  I told him to see the neurosurgeon just for a baseline visit in case the pain returns.  Call if any problem.  Precautions discussed.  Electronically Signed Darreld Mclean, MD 10/1/20248:06 AM

## 2023-05-20 DIAGNOSIS — Z6841 Body Mass Index (BMI) 40.0 and over, adult: Secondary | ICD-10-CM | POA: Diagnosis not present

## 2023-05-20 DIAGNOSIS — M47812 Spondylosis without myelopathy or radiculopathy, cervical region: Secondary | ICD-10-CM | POA: Diagnosis not present

## 2023-06-01 DIAGNOSIS — R6889 Other general symptoms and signs: Secondary | ICD-10-CM | POA: Diagnosis not present

## 2023-06-01 DIAGNOSIS — M47812 Spondylosis without myelopathy or radiculopathy, cervical region: Secondary | ICD-10-CM | POA: Diagnosis not present

## 2023-06-01 NOTE — Progress Notes (Signed)
Letter sent.

## 2023-06-04 DIAGNOSIS — M47812 Spondylosis without myelopathy or radiculopathy, cervical region: Secondary | ICD-10-CM | POA: Diagnosis not present

## 2023-06-04 DIAGNOSIS — Z6841 Body Mass Index (BMI) 40.0 and over, adult: Secondary | ICD-10-CM | POA: Diagnosis not present

## 2023-06-07 DIAGNOSIS — I10 Essential (primary) hypertension: Secondary | ICD-10-CM | POA: Diagnosis not present

## 2023-06-07 DIAGNOSIS — E785 Hyperlipidemia, unspecified: Secondary | ICD-10-CM | POA: Diagnosis not present

## 2023-06-22 DIAGNOSIS — H04123 Dry eye syndrome of bilateral lacrimal glands: Secondary | ICD-10-CM | POA: Diagnosis not present

## 2023-06-29 ENCOUNTER — Ambulatory Visit: Payer: Medicare HMO | Admitting: Urology

## 2023-06-29 VITALS — BP 121/74 | HR 67

## 2023-06-29 DIAGNOSIS — N400 Enlarged prostate without lower urinary tract symptoms: Secondary | ICD-10-CM | POA: Diagnosis not present

## 2023-06-29 DIAGNOSIS — R3129 Other microscopic hematuria: Secondary | ICD-10-CM | POA: Diagnosis not present

## 2023-06-29 MED ORDER — CIPROFLOXACIN HCL 500 MG PO TABS
500.0000 mg | ORAL_TABLET | Freq: Once | ORAL | Status: AC
Start: 2023-06-29 — End: 2023-06-29
  Administered 2023-06-29: 500 mg via ORAL

## 2023-06-29 MED ORDER — FINASTERIDE 5 MG PO TABS: 5.0000 mg | ORAL_TABLET | Freq: Every day | ORAL | 3 refills | Status: DC

## 2023-06-29 NOTE — Progress Notes (Unsigned)
   06/29/23  CC: microhematuria   HPI: [name] is a 72yo here for cystoscopy for microhematuria Blood pressure 121/74, pulse 67. NED. A&Ox3.   No respiratory distress   Abd soft, NT, ND Normal phallus with bilateral descended testicles  Cystoscopy Procedure Note  Patient identification was confirmed, informed consent was obtained, and patient was prepped using Betadine solution.  Lidocaine jelly was administered per urethral meatus.     Pre-Procedure: - Inspection reveals a normal caliber ureteral meatus.  Procedure: The flexible cystoscope was introduced without difficulty - No urethral strictures/lesions are present. - Enlarged prostate no median lobe. Erythematous prostatic urethra and bladder neck - Normal bladder neck - Bilateral ureteral orifices identified - Bladder mucosa  reveals no ulcers, tumors, or lesions - No bladder stones - No trabeculation    Post-Procedure: - Patient tolerated the procedure well  Assessment/ Plan: We will start finasteride 5mg  daily  No follow-ups on file.  Wilkie Aye, MD

## 2023-06-30 ENCOUNTER — Encounter: Payer: Self-pay | Admitting: Urology

## 2023-06-30 LAB — URINALYSIS, ROUTINE W REFLEX MICROSCOPIC
Bilirubin, UA: NEGATIVE
Glucose, UA: NEGATIVE
Ketones, UA: NEGATIVE
Leukocytes,UA: NEGATIVE
Nitrite, UA: NEGATIVE
Protein,UA: NEGATIVE
Specific Gravity, UA: 1.025 (ref 1.005–1.030)
Urobilinogen, Ur: 1 mg/dL (ref 0.2–1.0)
pH, UA: 6 (ref 5.0–7.5)

## 2023-06-30 LAB — MICROSCOPIC EXAMINATION
Bacteria, UA: NONE SEEN
WBC, UA: NONE SEEN /[HPF] (ref 0–5)

## 2023-06-30 NOTE — Patient Instructions (Signed)

## 2023-07-06 DIAGNOSIS — J0100 Acute maxillary sinusitis, unspecified: Secondary | ICD-10-CM | POA: Diagnosis not present

## 2023-07-06 DIAGNOSIS — I10 Essential (primary) hypertension: Secondary | ICD-10-CM | POA: Diagnosis not present

## 2023-08-05 DIAGNOSIS — E785 Hyperlipidemia, unspecified: Secondary | ICD-10-CM | POA: Diagnosis not present

## 2023-08-05 DIAGNOSIS — I10 Essential (primary) hypertension: Secondary | ICD-10-CM | POA: Diagnosis not present

## 2023-08-24 ENCOUNTER — Other Ambulatory Visit: Payer: Medicare HMO

## 2023-08-24 ENCOUNTER — Telehealth: Payer: Self-pay | Admitting: "Endocrinology

## 2023-08-24 DIAGNOSIS — R3129 Other microscopic hematuria: Secondary | ICD-10-CM

## 2023-08-24 DIAGNOSIS — N5201 Erectile dysfunction due to arterial insufficiency: Secondary | ICD-10-CM | POA: Diagnosis not present

## 2023-08-24 DIAGNOSIS — N4889 Other specified disorders of penis: Secondary | ICD-10-CM

## 2023-08-24 DIAGNOSIS — E291 Testicular hypofunction: Secondary | ICD-10-CM | POA: Diagnosis not present

## 2023-08-24 DIAGNOSIS — E236 Other disorders of pituitary gland: Secondary | ICD-10-CM

## 2023-08-24 DIAGNOSIS — R350 Frequency of micturition: Secondary | ICD-10-CM

## 2023-08-24 DIAGNOSIS — Z86018 Personal history of other benign neoplasm: Secondary | ICD-10-CM

## 2023-08-24 NOTE — Telephone Encounter (Signed)
Update labs please

## 2023-08-25 LAB — COMPREHENSIVE METABOLIC PANEL
ALT: 26 [IU]/L (ref 0–44)
AST: 20 [IU]/L (ref 0–40)
Albumin: 4.7 g/dL (ref 3.8–4.8)
Alkaline Phosphatase: 65 [IU]/L (ref 44–121)
BUN/Creatinine Ratio: 9 — ABNORMAL LOW (ref 10–24)
BUN: 12 mg/dL (ref 8–27)
Bilirubin Total: 0.5 mg/dL (ref 0.0–1.2)
CO2: 23 mmol/L (ref 20–29)
Calcium: 9.5 mg/dL (ref 8.6–10.2)
Chloride: 102 mmol/L (ref 96–106)
Creatinine, Ser: 1.3 mg/dL — ABNORMAL HIGH (ref 0.76–1.27)
Globulin, Total: 2.1 g/dL (ref 1.5–4.5)
Glucose: 92 mg/dL (ref 70–99)
Potassium: 4.9 mmol/L (ref 3.5–5.2)
Sodium: 141 mmol/L (ref 134–144)
Total Protein: 6.8 g/dL (ref 6.0–8.5)
eGFR: 58 mL/min/{1.73_m2} — ABNORMAL LOW (ref >59)

## 2023-08-25 LAB — CBC
Hematocrit: 46.5 % (ref 37.5–51.0)
Hemoglobin: 15.6 g/dL (ref 13.0–17.7)
MCH: 30.2 pg (ref 26.6–33.0)
MCHC: 33.5 g/dL (ref 31.5–35.7)
MCV: 90 fL (ref 79–97)
Platelets: 160 10*3/uL (ref 150–450)
RBC: 5.16 x10E6/uL (ref 4.14–5.80)
RDW: 12.4 % (ref 11.6–15.4)
WBC: 3.1 10*3/uL — ABNORMAL LOW (ref 3.4–10.8)

## 2023-08-25 LAB — TESTOSTERONE,FREE AND TOTAL
Testosterone, Free: 12.3 pg/mL (ref 6.6–18.1)
Testosterone: 923 ng/dL — ABNORMAL HIGH (ref 264–916)

## 2023-08-25 NOTE — Telephone Encounter (Signed)
 Labs updated

## 2023-08-27 DIAGNOSIS — Z86018 Personal history of other benign neoplasm: Secondary | ICD-10-CM | POA: Diagnosis not present

## 2023-08-27 DIAGNOSIS — E236 Other disorders of pituitary gland: Secondary | ICD-10-CM | POA: Diagnosis not present

## 2023-08-28 LAB — TSH: TSH: 4.13 u[IU]/mL (ref 0.450–4.500)

## 2023-08-28 LAB — T4, FREE: Free T4: 1.34 ng/dL (ref 0.82–1.77)

## 2023-08-28 LAB — PROLACTIN: Prolactin: 13.7 ng/mL (ref 3.6–25.2)

## 2023-08-31 ENCOUNTER — Ambulatory Visit: Payer: Medicare HMO | Admitting: Urology

## 2023-08-31 VITALS — BP 115/69 | HR 74

## 2023-08-31 DIAGNOSIS — R3129 Other microscopic hematuria: Secondary | ICD-10-CM | POA: Diagnosis not present

## 2023-08-31 DIAGNOSIS — N138 Other obstructive and reflux uropathy: Secondary | ICD-10-CM

## 2023-08-31 DIAGNOSIS — E291 Testicular hypofunction: Secondary | ICD-10-CM

## 2023-08-31 DIAGNOSIS — R350 Frequency of micturition: Secondary | ICD-10-CM | POA: Diagnosis not present

## 2023-08-31 DIAGNOSIS — N401 Enlarged prostate with lower urinary tract symptoms: Secondary | ICD-10-CM

## 2023-08-31 MED ORDER — JATENZO 198 MG PO CAPS: 1.0000 | ORAL_CAPSULE | Freq: Two times a day (BID) | ORAL | 3 refills | Status: DC

## 2023-08-31 NOTE — Progress Notes (Signed)
 08/31/2023 10:14 AM   Michael Mills [birth year] [id number]  Referring provider: Carlette Benita Area, MD [address] [place],  [place] [address]  hypogonadism   HPI: Mr Lubitz is a 73yo here for followup for hypogonadism and BPH Testosterone  923, hemobin 15.6, CMP creatinine 1.5. He injects 100mg  testosterone  every 2 weeks. He is having pain and issues with injections and wishes to try an alternative IPS 12 QOl 1 on finasteride  5mg .  Urine stream strong. He has urinary frequency every 2-3 hours. No hematuria. No other complaints today   PMH: Past Medical History:  Diagnosis Date   Acid reflux    Acromegaly and pituitary gigantism (HCC)    Adrenal gland cancer (HCC)    Heart burn    High cholesterol    Hypertension    Pituitary gland disorder (HCC)    Sleep apnea    Stomach ulcer    Vitamin D  deficiency     Surgical History: Past Surgical History:  Procedure Laterality Date   ADRENALECTOMY  2005   CHOLECYSTECTOMY     FACIAL RECONSTRUCTION SURGERY  1991   PROSTATE BIOPSY  2017    Home Medications:  Allergies as of 08/31/2023   No Known Allergies      Medication List        Accurate as of August 31, 2023 10:14 AM. If you have any questions, ask your nurse or doctor.          ascorbic acid 500 MG tablet Commonly known as: VITAMIN C Take 500 mg by mouth daily.   carvedilol 12.5 MG tablet Commonly known as: COREG daily at 6 (six) AM.   finasteride  5 MG tablet Commonly known as: PROSCAR  Take 1 tablet (5 mg total) by mouth daily.   hydrochlorothiazide 25 MG tablet Commonly known as: HYDRODIURIL daily at 6 (six) AM.   lovastatin 20 MG tablet Commonly known as: MEVACOR Take 20 mg by mouth daily.   NEEDLE (DISP) 18 G 18G X 1 Misc Use to draw up injection   pregabalin 75 MG capsule Commonly known as: LYRICA daily at 6 (six) AM.   sildenafil  100 MG tablet Commonly known as: VIAGRA  Take 1 tablet (100 mg total) by mouth daily as  needed for erectile dysfunction.   SYRINGE-NEEDLE (DISP) 3 ML 22G X 1 3 ML Misc Use to inject testosterone    tadalafil  20 MG tablet Commonly known as: CIALIS  Take 1 tablet (20 mg total) by mouth daily as needed.   testosterone  cypionate 200 MG/ML injection Commonly known as: DEPOTESTOSTERONE CYPIONATE Inject 0.5 mLs (100 mg total) into the muscle every 14 (fourteen) days.   tobramycin-dexamethasone ophthalmic solution Commonly known as: TOBRADEX daily at 6 (six) AM.   Vitamin D  (Ergocalciferol ) 1.25 MG (50000 UNIT) Caps capsule Commonly known as: DRISDOL  TAKE 1 CAPSULE (50,000 UNITS TOTAL) BY MOUTH EVERY 7 (SEVEN) DAYS.        Allergies: No Known Allergies  Family History: Family History  Problem Relation Age of Onset   Heart disease Father    Diabetes Mellitus II Father    Cancer Mother     Social History:  reports that he has never smoked. He has never used smokeless tobacco. He reports that he does not drink alcohol  and does not use drugs.  ROS: All other review of systems were reviewed and are negative except what is noted above in HPI  Physical Exam: BP 115/69   Pulse 74   Constitutional:  Alert and oriented, No acute distress.  HEENT: [place] AT, moist mucus membranes.  Trachea midline, no masses. Cardiovascular: No clubbing, cyanosis, or edema. Respiratory: Normal respiratory effort, no increased work of breathing. GI: Abdomen is soft, nontender, nondistended, no abdominal masses GU: No CVA tenderness.  Lymph: No cervical or inguinal lymphadenopathy. Skin: No rashes, bruises or suspicious lesions. Neurologic: Grossly intact, no focal deficits, moving all 4 extremities. Psychiatric: Normal mood and affect.  Laboratory Data: Lab Results  Component Value Date   WBC 3.1 (L) 08/24/2023   HGB 15.6 08/24/2023   HCT 46.5 08/24/2023   MCV 90 08/24/2023   PLT 160 08/24/2023    Lab Results  Component Value Date   CREATININE 1.30 (H) 08/24/2023    No results  found for: PSA  Lab Results  Component Value Date   TESTOSTERONE  923 (H) 08/24/2023    No results found for: HGBA1C  Urinalysis    Component Value Date/Time   APPEARANCEUR Clear 06/29/2023 1535   GLUCOSEU Negative 06/29/2023 1535   BILIRUBINUR Negative 06/29/2023 1535   PROTEINUR Negative 06/29/2023 1535   NITRITE Negative 06/29/2023 1535   LEUKOCYTESUR Negative 06/29/2023 1535    Lab Results  Component Value Date   LABMICR See below: 06/29/2023   WBCUA None seen 06/29/2023   LABEPIT 0-10 06/29/2023   MUCUS Present 07/10/2021   BACTERIA None seen 06/29/2023    Pertinent Imaging:  No results found for this or any previous visit.  No results found for this or any previous visit.  No results found for this or any previous visit.  No results found for this or any previous visit.  No results found for this or any previous visit.  No results found for this or any previous visit.  No results found for this or any previous visit.  No results found for this or any previous visit.   Assessment & Plan:    1. 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 BPh with urinary frequency Finasteride  5mg  daily   No follow-ups on file.  [name], MD  [facility] [place]

## 2023-09-03 ENCOUNTER — Encounter: Payer: Self-pay | Admitting: Urology

## 2023-09-03 ENCOUNTER — Encounter: Payer: Self-pay | Admitting: "Endocrinology

## 2023-09-03 ENCOUNTER — Ambulatory Visit: Payer: Medicare HMO | Admitting: "Endocrinology

## 2023-09-03 VITALS — BP 144/78 | HR 64 | Ht 69.0 in | Wt 189.8 lb

## 2023-09-03 DIAGNOSIS — E559 Vitamin D deficiency, unspecified: Secondary | ICD-10-CM | POA: Diagnosis not present

## 2023-09-03 DIAGNOSIS — Z86018 Personal history of other benign neoplasm: Secondary | ICD-10-CM

## 2023-09-03 DIAGNOSIS — E236 Other disorders of pituitary gland: Secondary | ICD-10-CM

## 2023-09-03 NOTE — Progress Notes (Signed)
09/03/2023, 4:02 PM  Endocrinology follow-up note   Subjective:    Patient ID: Michael Mills, male    DOB: 05/16/1951, PCP Benetta Spar, MD   Past Medical History:  Diagnosis Date   Acid reflux    Acromegaly and pituitary gigantism (HCC)    Adrenal gland cancer (HCC)    Heart burn    High cholesterol    Hypertension    Pituitary gland disorder (HCC)    Sleep apnea    Stomach ulcer    Vitamin D deficiency    Past Surgical History:  Procedure Laterality Date   ADRENALECTOMY  2005   CHOLECYSTECTOMY     FACIAL RECONSTRUCTION SURGERY  1991   PROSTATE BIOPSY  2017   Social History   Socioeconomic History   Marital status: Married    Spouse name: Not on file   Number of children: 2   Years of education: Not on file   Highest education level: Not on file  Occupational History   Not on file  Tobacco Use   Smoking status: Never   Smokeless tobacco: Never  Substance and Sexual Activity   Alcohol use: Never   Drug use: Never   Sexual activity: Not on file  Other Topics Concern   Not on file  Social History Narrative   Not on file   Social Drivers of Health   Financial Resource Strain: Not on file  Food Insecurity: Not on file  Transportation Needs: Not on file  Physical Activity: Not on file  Stress: Not on file  Social Connections: Not on file   Family History  Problem Relation Age of Onset   Heart disease Father    Diabetes Mellitus II Father    Cancer Mother    Outpatient Encounter Medications as of 09/03/2023  Medication Sig   carvedilol (COREG) 12.5 MG tablet daily at 6 (six) AM.   finasteride (PROSCAR) 5 MG tablet Take 1 tablet (5 mg total) by mouth daily.   hydrochlorothiazide (HYDRODIURIL) 25 MG tablet daily at 6 (six) AM.   lovastatin (MEVACOR) 20 MG tablet Take 20 mg by mouth daily.   NEEDLE, DISP, 18 G 18G X 1" MISC Use to draw up injection   pregabalin (LYRICA) 75 MG capsule  daily at 6 (six) AM.   sildenafil (VIAGRA) 100 MG tablet Take 1 tablet (100 mg total) by mouth daily as needed for erectile dysfunction.   SYRINGE-NEEDLE, DISP, 3 ML 22G X 1" 3 ML MISC Use to inject testosterone   tadalafil (CIALIS) 20 MG tablet Take 1 tablet (20 mg total) by mouth daily as needed.   testosterone cypionate (DEPOTESTOSTERONE CYPIONATE) 200 MG/ML injection Inject 0.5 mLs (100 mg total) into the muscle every 14 (fourteen) days.   Testosterone Undecanoate (JATENZO) 198 MG CAPS Take 1 tablet by mouth 2 (two) times daily.   tobramycin-dexamethasone (TOBRADEX) ophthalmic solution daily at 6 (six) AM.   vitamin C (ASCORBIC ACID) 500 MG tablet Take 500 mg by mouth daily.   Vitamin D, Ergocalciferol, (DRISDOL) 1.25 MG (50000 UNIT) CAPS capsule TAKE 1 CAPSULE (50,000 UNITS TOTAL) BY MOUTH EVERY 7 (SEVEN) DAYS.   No facility-administered encounter medications on file as of 09/03/2023.   ALLERGIES: No  Known Allergies  VACCINATION STATUS:  There is no immunization history on file for this patient.  Michael Mills is 73 y.o. male who presents today for follow-up after he was seen in consultation  due to his history of  adrenal adenoma s/p resection in 2009 , with subsequent endocrine work-up being in still in normal ranges.    See notes from his last visit.  This consult is from  Benetta Spar, MD related to his prior history.  he is on ongoing treatment with testosterone by his local urologist.      He also has documented history of pituitary cyst on MRI studies done in 2017 and 2019 with no interval change.  A CT scan of brain performed in December 2020 did not show any changes or acute intracranial abnormality.  He has no new complaints today.  He denies headaches, visual field loss/deficit.  Abdominal CT performed in September 2024 did not show any adrenal lesions. 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   He is currently being treated  for hypertension on a stable regimen of Coreg 12.5 mg p.o. twice daily, hydrochlorothiazide 25 mg p.o. daily.   -He is on Lyrica for postherpetic neuralgia.  Review of Systems   Objective:       09/03/2023    2:52 PM 08/31/2023    9:53 AM 06/29/2023    3:34 PM  Vitals with BMI  Height 5\' 9"     Weight 189 lbs 13 oz    BMI 28.02    Systolic 144 115 045  Diastolic 78 69 74  Pulse 64 74 67    BP (!) 144/78   Pulse 64   Ht 5\' 9"  (1.753 m)   Wt 189 lb 12.8 oz (86.1 kg)   BMI 28.03 kg/m   Wt Readings from Last 3 Encounters:  09/03/23 189 lb 12.8 oz (86.1 kg)  04/21/23 187 lb (84.8 kg)  03/24/23 187 lb (84.8 kg)    Physical Exam    CMP ( most recent) CMP     Component Value Date/Time   NA 141 08/24/2023 1305   K 4.9 08/24/2023 1305   CL 102 08/24/2023 1305   CO2 23 08/24/2023 1305   GLUCOSE 92 08/24/2023 1305   BUN 12 08/24/2023 1305   CREATININE 1.30 (H) 08/24/2023 1305   CALCIUM 9.5 08/24/2023 1305   PROT 6.8 08/24/2023 1305   ALBUMIN 4.7 08/24/2023 1305   AST 20 08/24/2023 1305   ALT 26 08/24/2023 1305   ALKPHOS 65 08/24/2023 1305   BILITOT 0.5 08/24/2023 1305   GFRNONAA 76 07/24/2020 1016   GFRAA 88 07/24/2020 1016   Recent Results (from the past 2160 hours)  Urinalysis, Routine w reflex microscopic     Status: Abnormal   Collection Time: 06/29/23  3:35 PM  Result Value Ref Range   Specific Gravity, UA 1.025 1.005 - 1.030   pH, UA 6.0 5.0 - 7.5   Color, UA Yellow Yellow   Appearance Ur Clear Clear   Leukocytes,UA Negative Negative   Protein,UA Negative Negative/Trace   Glucose, UA Negative Negative   Ketones, UA Negative Negative   RBC, UA 1+ (A) Negative   Bilirubin, UA Negative Negative   Urobilinogen, Ur 1.0 0.2 - 1.0 mg/dL   Nitrite, UA Negative Negative   Microscopic Examination See below:     Comment: Microscopic was indicated and was performed.  Microscopic Examination     Status: Abnormal   Collection Time:  06/29/23  3:35 PM   Urine   Result Value Ref Range   WBC, UA None seen 0 - 5 /hpf   RBC, Urine 3-10 (A) 0 - 2 /hpf   Epithelial Cells (non renal) 0-10 0 - 10 /hpf   Bacteria, UA None seen None seen/Few  Testosterone,Free and Total     Status: Abnormal   Collection Time: 08/24/23  1:05 PM  Result Value Ref Range   Testosterone 923 (H) 264 - 916 ng/dL    Comment: Adult male reference interval is based on a population of healthy nonobese males (BMI <30) between 43 and 19 years old. Travison, et.al. JCEM [phone]. PMID: [id number].    Testosterone, Free 12.3 6.6 - 18.1 pg/mL  Comprehensive metabolic panel     Status: Abnormal   Collection Time: 08/24/23  1:05 PM  Result Value Ref Range   Glucose 92 70 - 99 mg/dL   BUN 12 8 - 27 mg/dL   Creatinine, Ser 0.27 (H) 0.76 - 1.27 mg/dL   eGFR 58 (L) >25 DG/UYQ/0.34   BUN/Creatinine Ratio 9 (L) 10 - 24   Sodium 141 134 - 144 mmol/L   Potassium 4.9 3.5 - 5.2 mmol/L   Chloride 102 96 - 106 mmol/L   CO2 23 20 - 29 mmol/L   Calcium 9.5 8.6 - 10.2 mg/dL   Total Protein 6.8 6.0 - 8.5 g/dL   Albumin 4.7 3.8 - 4.8 g/dL   Globulin, Total 2.1 1.5 - 4.5 g/dL   Bilirubin Total 0.5 0.0 - 1.2 mg/dL   Alkaline Phosphatase 65 44 - 121 IU/L   AST 20 0 - 40 IU/L   ALT 26 0 - 44 IU/L  CBC     Status: Abnormal   Collection Time: 08/24/23  1:05 PM  Result Value Ref Range   WBC 3.1 (L) 3.4 - 10.8 x10E3/uL   RBC 5.16 4.14 - 5.80 x10E6/uL   Hemoglobin 15.6 13.0 - 17.7 g/dL   Hematocrit 74.2 59.5 - 51.0 %   MCV 90 79 - 97 fL   MCH 30.2 26.6 - 33.0 pg   MCHC 33.5 31.5 - 35.7 g/dL   RDW 63.8 75.6 - 43.3 %   Platelets 160 150 - 450 x10E3/uL  T4, Free     Status: None   Collection Time: 08/27/23  9:16 AM  Result Value Ref Range   Free T4 1.34 0.82 - 1.77 ng/dL  TSH     Status: None   Collection Time: 08/27/23  9:16 AM  Result Value Ref Range   TSH 4.130 0.450 - 4.500 uIU/mL  Prolactin     Status: None   Collection Time: 08/27/23  9:16 AM  Result Value Ref Range    Prolactin 13.7 3.6 - 25.2 ng/mL     MRI of pituitary/brain without contrast performed on August 20, 2017 showed stable left-sided benign appearing 7.5 x 4 mm cyst/cystic focus in the upper posterior left pituitary gland.   Assessment & Plan:   1. History of adrenal adenoma -He is status post adenoma resection in 2009 in [place]. The details of his surgery and reports are not available to review.  His subsequent endocrine workup remains stable/unremarkable.    He is not on adrenal  steroid replacement.  He has well-controlled hypertension on carvedilol and HCTZ.  His recent abdominal CT did not show adrenal lesions.  He will have repeat prolactin, a.m. cortisol and thyroid function tests in a year.  2.  Pituitary cyst (HCC) -Two MRI  studies between  2017 and 2019 did not show significant change in his documented tiny pituitary cyst measuring 7.5 mm. A 2020 CT scan of the head did not show any new findings.  His thyroid function test, prolactin, a.m. cortisol as well as IGF-I have been documented to be within normal limits.  No further intervention needed at this time.    3. Hypogonadism, male -He has taken testosterone supplement for several years. He obtains his prescriptions from his urologist. Most recent labs show total testosterone of 923.  This is considered supraphysiologic for him.   I did not make any changes on this. He is currently on testosterone 100 mg IM every 14 days.  His PSA is 2 .1  improving from 3.4.   His recent CT scan showed possible BPH.  He is encouraged to keep his appointment with his urologist.  4. Vitamin D deficiency   He is advised to maintain with over-the-counter vitamin D3 5000 units daily.    - he is advised to maintain close follow up with Benetta Spar, MD for primary care needs.    I spent  22  minutes in the care of the patient today including review of labs from Thyroid Function, CMP, and other relevant labs ; imaging/biopsy records  (current and previous including abstractions from other facilities); face-to-face time discussing  his lab results and symptoms, medications doses, his options of short and long term treatment based on the latest standards of care / guidelines;   and documenting the encounter.  Michael Mills  participated in the discussions, expressed understanding, and voiced agreement with the above plans.  All questions were answered to his satisfaction. he is encouraged to contact clinic should he have any questions or concerns prior to his return visit.   Follow up plan: Return in about 1 year (around 09/02/2024) for Fasting Labs  in AM B4 8.   Marquis Lunch, MD [facility] [address] [place], [place] [address] Phone: [phone]  Fax: [phone]     09/03/2023, 4:02 PM  This note was partially dictated with voice recognition software. Similar sounding words can be transcribed inadequately or may not  be corrected upon review.

## 2023-09-03 NOTE — Patient Instructions (Signed)

## 2023-09-04 ENCOUNTER — Telehealth: Payer: Self-pay | Admitting: Urology

## 2023-09-04 NOTE — Telephone Encounter (Signed)
Return call to patient. Patient states pharmacy is awaiting Prior authorization. Patient is made aware PA has been completed and pending waiting on approval. Patient voiced understanding.

## 2023-09-04 NOTE — Telephone Encounter (Signed)
Still does not have RX pharmacy advised waiting on Dr.

## 2023-09-05 DIAGNOSIS — E785 Hyperlipidemia, unspecified: Secondary | ICD-10-CM | POA: Diagnosis not present

## 2023-09-05 DIAGNOSIS — I10 Essential (primary) hypertension: Secondary | ICD-10-CM | POA: Diagnosis not present

## 2023-09-10 ENCOUNTER — Telehealth: Payer: Self-pay

## 2023-09-10 NOTE — Telephone Encounter (Signed)
Patient is needing to pick up prescribed Rx, [facility] is needing more info to fill Rx.   PHARMACY:  [id number] - [place], [place] - [address] AT [facility]

## 2023-09-10 NOTE — Telephone Encounter (Signed)
Patient called and made aware of [employer]'s denial and recommendations covered under his plan. Patient informed that alternatives have been sent to provider. Patient voiced understanding.

## 2023-09-18 ENCOUNTER — Telehealth: Payer: Self-pay

## 2023-09-18 DIAGNOSIS — E291 Testicular hypofunction: Secondary | ICD-10-CM

## 2023-09-18 NOTE — Telephone Encounter (Signed)
   Please send in requested alternative per patient insurance.

## 2023-09-18 NOTE — Telephone Encounter (Signed)
-----   Message from Nurse Jill Side sent at 09/17/2023 10:26 AM EST ----- Medication change request

## 2023-09-21 MED ORDER — NEEDLE (DISP) 18G X 1" MISC
12 refills | Status: DC
Start: 2023-09-21 — End: 2024-06-08

## 2023-09-21 MED ORDER — SYRINGE/NEEDLE (DISP) 22G X 1" 3 ML MISC
12 refills | Status: DC
Start: 2023-09-21 — End: 2024-06-08

## 2023-09-21 NOTE — Telephone Encounter (Signed)
Patient needing Rx for needles  testosterone cypionate (DEPOTESTOSTERONE CYPIONATE) 200 MG/ML injection   He is currently out of needles for his injections.    Also wanting to move forward with pill form.  Asking for a lower cost of off brand equivalent.   Please advise.

## 2023-09-21 NOTE — Telephone Encounter (Signed)
Refill sent in advise on pill form

## 2023-09-24 ENCOUNTER — Other Ambulatory Visit: Payer: Self-pay

## 2023-09-24 NOTE — Telephone Encounter (Signed)
 Patient returned call and states he will be fine going back on the injections.

## 2023-09-24 NOTE — Telephone Encounter (Signed)
-----   Message from Johnie Nailer sent at 09/22/2023  8:41 AM EST ----- He would have to go back on injections ----- Message ----- From: Roselee Cong, LPN Sent: 03/26/3809   4:30 PM EST To: Marco Severs, MD  Please see alternative for jatenzo

## 2023-09-24 NOTE — Telephone Encounter (Signed)
 Patient called with no answer. Unable to leave voicemail.

## 2023-09-24 NOTE — Addendum Note (Signed)
 Addended by: Roselee Cong on: 09/24/2023 02:27 PM   Modules accepted: Orders

## 2023-09-30 ENCOUNTER — Encounter: Payer: Self-pay | Admitting: Urology

## 2023-10-01 MED ORDER — TESTOSTERONE CYPIONATE 200 MG/ML IM SOLN: 100.0000 mg | INTRAMUSCULAR | 3 refills | Status: DC

## 2023-10-26 ENCOUNTER — Telehealth: Payer: Self-pay

## 2023-10-26 NOTE — Telephone Encounter (Signed)
 Medication prior authorization request received.  Completed PA request through cover my meds for drug Testosterone. KEY: Key: [id number]  Approved: Pending

## 2023-10-27 DIAGNOSIS — I10 Essential (primary) hypertension: Secondary | ICD-10-CM | POA: Diagnosis not present

## 2023-10-27 DIAGNOSIS — E785 Hyperlipidemia, unspecified: Secondary | ICD-10-CM | POA: Diagnosis not present

## 2023-10-29 DIAGNOSIS — H52223 Regular astigmatism, bilateral: Secondary | ICD-10-CM | POA: Diagnosis not present

## 2023-11-06 ENCOUNTER — Telehealth: Payer: Self-pay

## 2023-11-06 DIAGNOSIS — L089 Local infection of the skin and subcutaneous tissue, unspecified: Secondary | ICD-10-CM

## 2023-11-06 MED ORDER — SULFAMETHOXAZOLE-TRIMETHOPRIM 800-160 MG PO TABS
1.0000 | ORAL_TABLET | Freq: Two times a day (BID) | ORAL | 0 refills | Status: DC
Start: 2023-11-06 — End: 2024-06-08

## 2023-11-06 NOTE — Telephone Encounter (Signed)
 Pt gave himself testosterone shot 03/16 and Pt states it went well and about two days ago it began to swell up and was hurting at the injection site c/b [phone] per verbal from MD McKenzie order Pt bactrim for possible infection Pt notified Rx ordered

## 2023-11-24 ENCOUNTER — Other Ambulatory Visit: Payer: Medicare HMO

## 2023-11-27 DIAGNOSIS — I10 Essential (primary) hypertension: Secondary | ICD-10-CM | POA: Diagnosis not present

## 2023-11-27 DIAGNOSIS — E785 Hyperlipidemia, unspecified: Secondary | ICD-10-CM | POA: Diagnosis not present

## 2023-11-30 ENCOUNTER — Ambulatory Visit: Payer: Medicare HMO | Admitting: Urology

## 2023-12-16 ENCOUNTER — Other Ambulatory Visit: Payer: Medicare HMO

## 2023-12-16 DIAGNOSIS — N401 Enlarged prostate with lower urinary tract symptoms: Secondary | ICD-10-CM | POA: Diagnosis not present

## 2023-12-16 DIAGNOSIS — E291 Testicular hypofunction: Secondary | ICD-10-CM | POA: Diagnosis not present

## 2023-12-16 DIAGNOSIS — N5201 Erectile dysfunction due to arterial insufficiency: Secondary | ICD-10-CM | POA: Diagnosis not present

## 2023-12-16 DIAGNOSIS — N138 Other obstructive and reflux uropathy: Secondary | ICD-10-CM | POA: Diagnosis not present

## 2023-12-17 LAB — CBC
Hematocrit: 47.8 % (ref 37.5–51.0)
Hemoglobin: 15.4 g/dL (ref 13.0–17.7)
MCH: 29.4 pg (ref 26.6–33.0)
MCHC: 32.2 g/dL (ref 31.5–35.7)
MCV: 91 fL (ref 79–97)
Platelets: 186 10*3/uL (ref 150–450)
RBC: 5.23 x10E6/uL (ref 4.14–5.80)
RDW: 12.7 % (ref 11.6–15.4)
WBC: 2.9 10*3/uL — ABNORMAL LOW (ref 3.4–10.8)

## 2023-12-17 LAB — COMPREHENSIVE METABOLIC PANEL WITH GFR
ALT: 18 IU/L (ref 0–44)
AST: 19 IU/L (ref 0–40)
Albumin: 4.4 g/dL (ref 3.8–4.8)
Alkaline Phosphatase: 66 IU/L (ref 44–121)
BUN/Creatinine Ratio: 11 (ref 10–24)
BUN: 13 mg/dL (ref 8–27)
Bilirubin Total: 0.7 mg/dL (ref 0.0–1.2)
CO2: 23 mmol/L (ref 20–29)
Calcium: 9.4 mg/dL (ref 8.6–10.2)
Chloride: 101 mmol/L (ref 96–106)
Creatinine, Ser: 1.23 mg/dL (ref 0.76–1.27)
Globulin, Total: 2.3 g/dL (ref 1.5–4.5)
Glucose: 92 mg/dL (ref 70–99)
Potassium: 4.9 mmol/L (ref 3.5–5.2)
Sodium: 137 mmol/L (ref 134–144)
Total Protein: 6.7 g/dL (ref 6.0–8.5)
eGFR: 62 mL/min/{1.73_m2} (ref >59)

## 2023-12-17 LAB — PSA: Prostate Specific Ag, Serum: 1.1 ng/mL (ref 0.0–4.0)

## 2023-12-17 LAB — TESTOSTERONE,FREE AND TOTAL
Testosterone, Free: 5.1 pg/mL — ABNORMAL LOW (ref 6.6–18.1)
Testosterone: 412 ng/dL (ref 264–916)

## 2023-12-22 DIAGNOSIS — H05401 Unspecified enophthalmos, right eye: Secondary | ICD-10-CM | POA: Diagnosis not present

## 2023-12-22 DIAGNOSIS — H1045 Other chronic allergic conjunctivitis: Secondary | ICD-10-CM | POA: Diagnosis not present

## 2023-12-22 DIAGNOSIS — H04123 Dry eye syndrome of bilateral lacrimal glands: Secondary | ICD-10-CM | POA: Diagnosis not present

## 2023-12-22 DIAGNOSIS — H2513 Age-related nuclear cataract, bilateral: Secondary | ICD-10-CM | POA: Diagnosis not present

## 2023-12-23 ENCOUNTER — Other Ambulatory Visit: Payer: Medicare HMO

## 2023-12-25 ENCOUNTER — Ambulatory Visit: Payer: Medicare HMO | Admitting: Urology

## 2023-12-25 ENCOUNTER — Encounter: Payer: Self-pay | Admitting: Urology

## 2023-12-25 VITALS — BP 143/85 | HR 58

## 2023-12-25 DIAGNOSIS — N5201 Erectile dysfunction due to arterial insufficiency: Secondary | ICD-10-CM | POA: Diagnosis not present

## 2023-12-25 DIAGNOSIS — N401 Enlarged prostate with lower urinary tract symptoms: Secondary | ICD-10-CM

## 2023-12-25 DIAGNOSIS — R350 Frequency of micturition: Secondary | ICD-10-CM

## 2023-12-25 DIAGNOSIS — E291 Testicular hypofunction: Secondary | ICD-10-CM

## 2023-12-25 DIAGNOSIS — N138 Other obstructive and reflux uropathy: Secondary | ICD-10-CM

## 2023-12-25 MED ORDER — TESTOSTERONE CYPIONATE 200 MG/ML IM SOLN: 100.0000 mg | INTRAMUSCULAR | 3 refills | Status: DC

## 2023-12-25 MED ORDER — FINASTERIDE 5 MG PO TABS: 5.0000 mg | ORAL_TABLET | Freq: Every day | ORAL | 3 refills | Status: DC

## 2023-12-25 MED ORDER — SILDENAFIL CITRATE 100 MG PO TABS: 100.0000 mg | ORAL_TABLET | Freq: Every day | ORAL | 5 refills | Status: DC | PRN

## 2023-12-25 NOTE — Progress Notes (Unsigned)
 12/25/2023 10:28 AM   Michael Mills 21-Aug-1950 [id number]  Referring provider: Wyvonna Heidelberg, MD [address] [place],  [place] [address]  No chief complaint on file.   HPI: Michael Mills is a 72yo here for followup for hypogonadism, BPh and erectile dysfunction. Testosterone  412, PSA 1.1, hemoglobin 15.4. CMp normal on IM testosterone . Energy good.Libido improved. IPSS 9 QOL 2 on finasteride  5mg  daily. He uses sildenafil  100mg  prn with good results   PMH: Past Medical History:  Diagnosis Date   Acid reflux    Acromegaly and pituitary gigantism (HCC)    Adrenal gland cancer (HCC)    Heart burn    High cholesterol    Hypertension    Pituitary gland disorder (HCC)    Sleep apnea    Stomach ulcer    Vitamin D  deficiency     Surgical History: Past Surgical History:  Procedure Laterality Date   ADRENALECTOMY  2005   CHOLECYSTECTOMY     FACIAL RECONSTRUCTION SURGERY  1991   PROSTATE BIOPSY  2017    Home Medications:  Allergies as of 12/25/2023   No Known Allergies      Medication List        Accurate as of Dec 25, 2023 10:28 AM. If you have any questions, ask your nurse or doctor.          ascorbic acid 500 MG tablet Commonly known as: VITAMIN C Take 500 mg by mouth daily.   carvedilol 12.5 MG tablet Commonly known as: COREG daily at 6 (six) AM.   finasteride  5 MG tablet Commonly known as: PROSCAR  Take 1 tablet (5 mg total) by mouth daily.   hydrochlorothiazide 25 MG tablet Commonly known as: HYDRODIURIL daily at 6 (six) AM.   Jatenzo  198 MG Caps Generic drug: Testosterone  Undecanoate Take 1 tablet by mouth 2 (two) times daily.   lovastatin 20 MG tablet Commonly known as: MEVACOR Take 20 mg by mouth daily.   NEEDLE (DISP) 18 G 18G X 1" Misc Use to draw up injection   pregabalin 75 MG capsule Commonly known as: LYRICA daily at 6 (six) AM.   sildenafil  100 MG tablet Commonly known as: VIAGRA  Take 1 tablet (100 mg total)  by mouth daily as needed for erectile dysfunction.   sulfamethoxazole -trimethoprim  800-160 MG tablet Commonly known as: Bactrim  DS Take 1 tablet by mouth 2 (two) times daily.   SYRINGE-NEEDLE (DISP) 3 ML 22G X 1" 3 ML Misc Use to inject testosterone    tadalafil  20 MG tablet Commonly known as: CIALIS  Take 1 tablet (20 mg total) by mouth daily as needed.   testosterone  cypionate 200 MG/ML injection Commonly known as: DEPOTESTOSTERONE CYPIONATE Inject 0.5 mLs (100 mg total) into the muscle every 14 (fourteen) days.   tobramycin-dexamethasone ophthalmic solution Commonly known as: TOBRADEX daily at 6 (six) AM.        Allergies: No Known Allergies  Family History: Family History  Problem Relation Age of Onset   Heart disease Father    Diabetes Mellitus II Father    Cancer Mother     Social History:  reports that he has never smoked. He has never used smokeless tobacco. He reports that he does not drink alcohol  and does not use drugs.  ROS: All other review of systems were reviewed and are negative except what is noted above in HPI  Physical Exam: BP (!) 143/85   Pulse (!) 58   Constitutional:  Alert and oriented, No acute distress. HEENT: [place]  AT, moist mucus membranes.  Trachea midline, no masses. Cardiovascular: No clubbing, cyanosis, or edema. Respiratory: Normal respiratory effort, no increased work of breathing. GI: Abdomen is soft, nontender, nondistended, no abdominal masses GU: No CVA tenderness.  Lymph: No cervical or inguinal lymphadenopathy. Skin: No rashes, bruises or suspicious lesions. Neurologic: Grossly intact, no focal deficits, moving all 4 extremities. Psychiatric: Normal mood and affect.  Laboratory Data: Lab Results  Component Value Date   WBC 2.9 (L) 12/16/2023   HGB 15.4 12/16/2023   HCT 47.8 12/16/2023   MCV 91 12/16/2023   PLT 186 12/16/2023    Lab Results  Component Value Date   CREATININE 1.23 12/16/2023    No results found for:  "PSA"  Lab Results  Component Value Date   TESTOSTERONE  412 12/16/2023    No results found for: "HGBA1C"  Urinalysis    Component Value Date/Time   APPEARANCEUR Clear 06/29/2023 1535   GLUCOSEU Negative 06/29/2023 1535   BILIRUBINUR Negative 06/29/2023 1535   PROTEINUR Negative 06/29/2023 1535   NITRITE Negative 06/29/2023 1535   LEUKOCYTESUR Negative 06/29/2023 1535    Lab Results  Component Value Date   LABMICR See below: 06/29/2023   WBCUA None seen 06/29/2023   LABEPIT 0-10 06/29/2023   MUCUS Present 07/10/2021   BACTERIA None seen 06/29/2023    Pertinent Imaging:  No results found for this or any previous visit.  No results found for this or any previous visit.  No results found for this or any previous visit.  No results found for this or any previous visit.  No results found for this or any previous visit.  No results found for this or any previous visit.  No results found for this or any previous visit.  No results found for this or any previous visit.   Assessment & Plan:    1. 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 Benign prostatic hyperplasia with urinary obstruction Finasteride  5mg  daily  3. Urinary frequency Continue finasteride   4. Erectile dysfunction due to arterial insufficiency -sildenafil  100mg  prn   No follow-ups on file.  Johnie Nailer, MD  [facility] [place]

## 2023-12-25 NOTE — Patient Instructions (Signed)

## 2023-12-27 DIAGNOSIS — E785 Hyperlipidemia, unspecified: Secondary | ICD-10-CM | POA: Diagnosis not present

## 2023-12-27 DIAGNOSIS — I10 Essential (primary) hypertension: Secondary | ICD-10-CM | POA: Diagnosis not present

## 2024-01-06 ENCOUNTER — Other Ambulatory Visit (HOSPITAL_COMMUNITY): Payer: Self-pay | Admitting: Gerontology

## 2024-01-06 ENCOUNTER — Ambulatory Visit (HOSPITAL_COMMUNITY)
Admission: RE | Admit: 2024-01-06 | Discharge: 2024-01-06 | Disposition: A | Discharge status: Home / Self Care | Source: Ambulatory Visit | Attending: Gerontology | Admitting: Gerontology

## 2024-01-06 DIAGNOSIS — R059 Cough, unspecified: Secondary | ICD-10-CM | POA: Insufficient documentation

## 2024-01-06 DIAGNOSIS — I10 Essential (primary) hypertension: Secondary | ICD-10-CM | POA: Diagnosis not present

## 2024-01-06 DIAGNOSIS — R052 Subacute cough: Secondary | ICD-10-CM | POA: Diagnosis not present

## 2024-02-06 DIAGNOSIS — E785 Hyperlipidemia, unspecified: Secondary | ICD-10-CM | POA: Diagnosis not present

## 2024-02-06 DIAGNOSIS — I10 Essential (primary) hypertension: Secondary | ICD-10-CM | POA: Diagnosis not present

## 2024-02-24 DIAGNOSIS — H1045 Other chronic allergic conjunctivitis: Secondary | ICD-10-CM | POA: Diagnosis not present

## 2024-02-24 DIAGNOSIS — H05401 Unspecified enophthalmos, right eye: Secondary | ICD-10-CM | POA: Diagnosis not present

## 2024-02-24 DIAGNOSIS — H2513 Age-related nuclear cataract, bilateral: Secondary | ICD-10-CM | POA: Diagnosis not present

## 2024-02-24 DIAGNOSIS — H04123 Dry eye syndrome of bilateral lacrimal glands: Secondary | ICD-10-CM | POA: Diagnosis not present

## 2024-03-07 DIAGNOSIS — I10 Essential (primary) hypertension: Secondary | ICD-10-CM | POA: Diagnosis not present

## 2024-03-07 DIAGNOSIS — E785 Hyperlipidemia, unspecified: Secondary | ICD-10-CM | POA: Diagnosis not present

## 2024-03-18 DIAGNOSIS — K2100 Gastro-esophageal reflux disease with esophagitis, without bleeding: Secondary | ICD-10-CM | POA: Diagnosis not present

## 2024-03-18 DIAGNOSIS — I10 Essential (primary) hypertension: Secondary | ICD-10-CM | POA: Diagnosis not present

## 2024-03-18 DIAGNOSIS — Z86018 Personal history of other benign neoplasm: Secondary | ICD-10-CM | POA: Diagnosis not present

## 2024-03-18 DIAGNOSIS — Z1331 Encounter for screening for depression: Secondary | ICD-10-CM | POA: Diagnosis not present

## 2024-03-18 DIAGNOSIS — Z1389 Encounter for screening for other disorder: Secondary | ICD-10-CM | POA: Diagnosis not present

## 2024-03-18 DIAGNOSIS — E559 Vitamin D deficiency, unspecified: Secondary | ICD-10-CM | POA: Diagnosis not present

## 2024-03-18 DIAGNOSIS — E785 Hyperlipidemia, unspecified: Secondary | ICD-10-CM | POA: Diagnosis not present

## 2024-03-18 DIAGNOSIS — Z0001 Encounter for general adult medical examination with abnormal findings: Secondary | ICD-10-CM | POA: Diagnosis not present

## 2024-03-18 DIAGNOSIS — M542 Cervicalgia: Secondary | ICD-10-CM | POA: Diagnosis not present

## 2024-03-18 DIAGNOSIS — N401 Enlarged prostate with lower urinary tract symptoms: Secondary | ICD-10-CM | POA: Diagnosis not present

## 2024-04-18 DIAGNOSIS — E785 Hyperlipidemia, unspecified: Secondary | ICD-10-CM | POA: Diagnosis not present

## 2024-04-18 DIAGNOSIS — I10 Essential (primary) hypertension: Secondary | ICD-10-CM | POA: Diagnosis not present

## 2024-04-26 ENCOUNTER — Encounter: Payer: Self-pay | Admitting: Gastroenterology

## 2024-05-18 DIAGNOSIS — I10 Essential (primary) hypertension: Secondary | ICD-10-CM | POA: Diagnosis not present

## 2024-05-18 DIAGNOSIS — E785 Hyperlipidemia, unspecified: Secondary | ICD-10-CM | POA: Diagnosis not present

## 2024-06-07 NOTE — Progress Notes (Unsigned)
 GI Office Note    Referring Provider: Carlette Benita Area* Primary Care Physician:  Carlette Benita Area, MD  Primary Gastroenterologist:  Chief Complaint   No chief complaint on file.   History of Present Illness   Michael Mills is a 73 y.o. male presenting today    ?cirrhosis ?[facility] labs  Prior Data   Colonoscopy 04/2019: -diverticulosis  CT A/P with and without contrast 05/18/23: IMPRESSION: -No CT findings to account for the patient's hematuria. -Prostatomegaly, suggesting BPH. -Mildly nodular hepatic contour, raising the possibility of early cirrhosis.  Medications   Current Outpatient Medications  Medication Sig Dispense Refill   carvedilol (COREG) 12.5 MG tablet daily at 6 (six) AM.     finasteride  (PROSCAR ) 5 MG tablet Take 1 tablet (5 mg total) by mouth daily. 90 tablet 3   hydrochlorothiazide (HYDRODIURIL) 25 MG tablet daily at 6 (six) AM.     lovastatin (MEVACOR) 20 MG tablet Take 20 mg by mouth daily.     NEEDLE, DISP, 18 G 18G X 1 MISC Use to draw up injection 2 each 12   pregabalin (LYRICA) 75 MG capsule daily at 6 (six) AM.     sildenafil  (VIAGRA ) 100 MG tablet Take 1 tablet (100 mg total) by mouth daily as needed for erectile dysfunction. 10 tablet 5   sulfamethoxazole -trimethoprim  (BACTRIM  DS) 800-160 MG tablet Take 1 tablet by mouth 2 (two) times daily. 14 tablet 0   SYRINGE-NEEDLE, DISP, 3 ML 22G X 1 3 ML MISC Use to inject testosterone  2 each 12   tadalafil  (CIALIS ) 20 MG tablet Take 1 tablet (20 mg total) by mouth daily as needed. 30 tablet 5   testosterone  cypionate (DEPOTESTOSTERONE CYPIONATE) 200 MG/ML injection Inject 0.5 mLs (100 mg total) into the muscle every 14 (fourteen) days. 10 mL 3   Testosterone  Undecanoate (JATENZO ) 198 MG CAPS Take 1 tablet by mouth 2 (two) times daily. 60 capsule 3   tobramycin-dexamethasone (TOBRADEX) ophthalmic solution daily at 6 (six) AM.     vitamin C (ASCORBIC ACID) 500 MG tablet Take 500 mg  by mouth daily.     No current facility-administered medications for this visit.    Allergies   Allergies as of 06/08/2024   (No Known Allergies)     Past Medical History   Past Medical History:  Diagnosis Date   Acid reflux    Acromegaly and pituitary gigantism (HCC)    Adrenal gland cancer (HCC)    Heart burn    High cholesterol    Hypertension    Pituitary gland disorder    Sleep apnea    Stomach ulcer    Vitamin D  deficiency     Past Surgical History   Past Surgical History:  Procedure Laterality Date   ADRENALECTOMY  2005   CHOLECYSTECTOMY     FACIAL RECONSTRUCTION SURGERY  1991   PROSTATE BIOPSY  2017    Past Family History   Family History  Problem Relation Age of Onset   Heart disease Father    Diabetes Mellitus II Father    Cancer Mother     Past Social History   Social History   Socioeconomic History   Marital status: Married    Spouse name: Not on file   Number of children: 2   Years of education: Not on file   Highest education level: Not on file  Occupational History   Not on file  Tobacco Use   Smoking status: Never   Smokeless tobacco:  Never  Substance and Sexual Activity   Alcohol  use: Never   Drug use: Never   Sexual activity: Not on file  Other Topics Concern   Not on file  Social History Narrative   Not on file   Social Drivers of Health   Financial Resource Strain: Not on file  Food Insecurity: Not on file  Transportation Needs: Not on file  Physical Activity: Not on file  Stress: Not on file  Social Connections: Not on file  Intimate Partner Violence: Not on file    Review of Systems   General: Negative for anorexia, weight loss, fever, chills, fatigue, weakness. ENT: Negative for hoarseness, difficulty swallowing , nasal congestion. CV: Negative for chest pain, angina, palpitations, dyspnea on exertion, peripheral edema.  Respiratory: Negative for dyspnea at rest, dyspnea on exertion, cough, sputum, wheezing.   GI: See history of present illness. GU:  Negative for dysuria, hematuria, urinary incontinence, urinary frequency, nocturnal urination.  Endo: Negative for unusual weight change.     Physical Exam   There were no vitals taken for this visit.   General: Well-nourished, well-developed in no acute distress.  Eyes: No icterus. Mouth: Oropharyngeal mucosa moist and pink   Lungs: Clear to auscultation bilaterally.  Heart: Regular rate and rhythm, no murmurs rubs or gallops.  Abdomen: Bowel sounds are normal, nontender, nondistended, no hepatosplenomegaly or masses,  no abdominal bruits or hernia , no rebound or guarding.  Rectal: not performed Extremities: No lower extremity edema. No clubbing or deformities. Neuro: Alert and oriented x 4   Skin: Warm and dry, no jaundice.   Psych: Alert and cooperative, normal mood and affect.  Labs   Lab Results  Component Value Date   NA 137 12/16/2023   CL 101 12/16/2023   K 4.9 12/16/2023   CO2 23 12/16/2023   BUN 13 12/16/2023   CREATININE 1.23 12/16/2023   EGFR 62 12/16/2023   CALCIUM 9.4 12/16/2023   ALBUMIN 4.4 12/16/2023   GLUCOSE 92 12/16/2023   Lab Results  Component Value Date   WBC 2.9 (L) 12/16/2023   HGB 15.4 12/16/2023   HCT 47.8 12/16/2023   MCV 91 12/16/2023   PLT 186 12/16/2023   Lab Results  Component Value Date   ALT 18 12/16/2023   AST 19 12/16/2023   ALKPHOS 66 12/16/2023   BILITOT 0.7 12/16/2023   Lab Results  Component Value Date   TSH 4.130 08/27/2023    Imaging Studies   No results found.  Assessment/Plan:           Michael Mills. Ezzard, MHS, PA-C [facility]

## 2024-06-07 NOTE — H&P (View-Only) (Signed)
 GI Office Note    Referring Provider: Carlette Benita Area* Primary Care Physician:  Carlette Benita Area, MD  Primary Gastroenterologist:  Chief Complaint   No chief complaint on file.   History of Present Illness   Michael Mills is a 73 y.o. male presenting today    ?cirrhosis ?[facility] labs  Prior Data   Colonoscopy 04/2019: -diverticulosis  CT A/P with and without contrast 05/18/23: IMPRESSION: -No CT findings to account for the patient's hematuria. -Prostatomegaly, suggesting BPH. -Mildly nodular hepatic contour, raising the possibility of early cirrhosis.  Medications   Current Outpatient Medications  Medication Sig Dispense Refill   carvedilol (COREG) 12.5 MG tablet daily at 6 (six) AM.     finasteride  (PROSCAR ) 5 MG tablet Take 1 tablet (5 mg total) by mouth daily. 90 tablet 3   hydrochlorothiazide (HYDRODIURIL) 25 MG tablet daily at 6 (six) AM.     lovastatin (MEVACOR) 20 MG tablet Take 20 mg by mouth daily.     NEEDLE, DISP, 18 G 18G X 1 MISC Use to draw up injection 2 each 12   pregabalin (LYRICA) 75 MG capsule daily at 6 (six) AM.     sildenafil  (VIAGRA ) 100 MG tablet Take 1 tablet (100 mg total) by mouth daily as needed for erectile dysfunction. 10 tablet 5   sulfamethoxazole -trimethoprim  (BACTRIM  DS) 800-160 MG tablet Take 1 tablet by mouth 2 (two) times daily. 14 tablet 0   SYRINGE-NEEDLE, DISP, 3 ML 22G X 1 3 ML MISC Use to inject testosterone  2 each 12   tadalafil  (CIALIS ) 20 MG tablet Take 1 tablet (20 mg total) by mouth daily as needed. 30 tablet 5   testosterone  cypionate (DEPOTESTOSTERONE CYPIONATE) 200 MG/ML injection Inject 0.5 mLs (100 mg total) into the muscle every 14 (fourteen) days. 10 mL 3   Testosterone  Undecanoate (JATENZO ) 198 MG CAPS Take 1 tablet by mouth 2 (two) times daily. 60 capsule 3   tobramycin-dexamethasone (TOBRADEX) ophthalmic solution daily at 6 (six) AM.     vitamin C (ASCORBIC ACID) 500 MG tablet Take 500 mg  by mouth daily.     No current facility-administered medications for this visit.    Allergies   Allergies as of 06/08/2024   (No Known Allergies)     Past Medical History   Past Medical History:  Diagnosis Date   Acid reflux    Acromegaly and pituitary gigantism (HCC)    Adrenal gland cancer (HCC)    Heart burn    High cholesterol    Hypertension    Pituitary gland disorder    Sleep apnea    Stomach ulcer    Vitamin D  deficiency     Past Surgical History   Past Surgical History:  Procedure Laterality Date   ADRENALECTOMY  2005   CHOLECYSTECTOMY     FACIAL RECONSTRUCTION SURGERY  1991   PROSTATE BIOPSY  2017    Past Family History   Family History  Problem Relation Age of Onset   Heart disease Father    Diabetes Mellitus II Father    Cancer Mother     Past Social History   Social History   Socioeconomic History   Marital status: Married    Spouse name: Not on file   Number of children: 2   Years of education: Not on file   Highest education level: Not on file  Occupational History   Not on file  Tobacco Use   Smoking status: Never   Smokeless tobacco:  Never  Substance and Sexual Activity   Alcohol  use: Never   Drug use: Never   Sexual activity: Not on file  Other Topics Concern   Not on file  Social History Narrative   Not on file   Social Drivers of Health   Financial Resource Strain: Not on file  Food Insecurity: Not on file  Transportation Needs: Not on file  Physical Activity: Not on file  Stress: Not on file  Social Connections: Not on file  Intimate Partner Violence: Not on file    Review of Systems   General: Negative for anorexia, weight loss, fever, chills, fatigue, weakness. ENT: Negative for hoarseness, difficulty swallowing , nasal congestion. CV: Negative for chest pain, angina, palpitations, dyspnea on exertion, peripheral edema.  Respiratory: Negative for dyspnea at rest, dyspnea on exertion, cough, sputum, wheezing.   GI: See history of present illness. GU:  Negative for dysuria, hematuria, urinary incontinence, urinary frequency, nocturnal urination.  Endo: Negative for unusual weight change.     Physical Exam   There were no vitals taken for this visit.   General: Well-nourished, well-developed in no acute distress.  Eyes: No icterus. Mouth: Oropharyngeal mucosa moist and pink   Lungs: Clear to auscultation bilaterally.  Heart: Regular rate and rhythm, no murmurs rubs or gallops.  Abdomen: Bowel sounds are normal, nontender, nondistended, no hepatosplenomegaly or masses,  no abdominal bruits or hernia , no rebound or guarding.  Rectal: not performed Extremities: No lower extremity edema. No clubbing or deformities. Neuro: Alert and oriented x 4   Skin: Warm and dry, no jaundice.   Psych: Alert and cooperative, normal mood and affect.  Labs   Lab Results  Component Value Date   NA 137 12/16/2023   CL 101 12/16/2023   K 4.9 12/16/2023   CO2 23 12/16/2023   BUN 13 12/16/2023   CREATININE 1.23 12/16/2023   EGFR 62 12/16/2023   CALCIUM 9.4 12/16/2023   ALBUMIN 4.4 12/16/2023   GLUCOSE 92 12/16/2023   Lab Results  Component Value Date   WBC 2.9 (L) 12/16/2023   HGB 15.4 12/16/2023   HCT 47.8 12/16/2023   MCV 91 12/16/2023   PLT 186 12/16/2023   Lab Results  Component Value Date   ALT 18 12/16/2023   AST 19 12/16/2023   ALKPHOS 66 12/16/2023   BILITOT 0.7 12/16/2023   Lab Results  Component Value Date   TSH 4.130 08/27/2023    Imaging Studies   No results found.  Assessment/Plan:           [name]. Ezzard, MHS, PA-C [facility]

## 2024-06-08 ENCOUNTER — Ambulatory Visit: Admitting: Gastroenterology

## 2024-06-08 ENCOUNTER — Encounter: Payer: Self-pay | Admitting: Gastroenterology

## 2024-06-08 VITALS — BP 127/77 | HR 75 | Temp 98.0°F | Ht 69.0 in | Wt 187.8 lb

## 2024-06-08 DIAGNOSIS — R932 Abnormal findings on diagnostic imaging of liver and biliary tract: Secondary | ICD-10-CM | POA: Insufficient documentation

## 2024-06-08 DIAGNOSIS — R198 Other specified symptoms and signs involving the digestive system and abdomen: Secondary | ICD-10-CM | POA: Diagnosis not present

## 2024-06-08 NOTE — Patient Instructions (Signed)
 I will review your latest labs and in the next few weeks we will work towards further evaluation of your your liver.   Colonoscopy to be scheduled.

## 2024-06-14 ENCOUNTER — Encounter: Payer: Self-pay | Admitting: *Deleted

## 2024-06-14 ENCOUNTER — Telehealth: Payer: Self-pay | Admitting: *Deleted

## 2024-06-14 ENCOUNTER — Other Ambulatory Visit: Payer: Self-pay | Admitting: *Deleted

## 2024-06-14 MED ORDER — PEG 3350-KCL-NA BICARB-NACL 420 G PO SOLR: 4000.0000 mL | Freq: Once | ORAL | 0 refills | Status: AC

## 2024-06-14 NOTE — Telephone Encounter (Signed)
 Called to schedule pt to schedule for procedure. He states he is with wife at a doctors appointment and will call back to schedule  TCS w/Dr.Rourk, asa 3, ok rm 1-2 Bisacodyl 55m every day x 3 days before prep starts

## 2024-06-18 DIAGNOSIS — K5900 Constipation, unspecified: Secondary | ICD-10-CM | POA: Diagnosis not present

## 2024-06-18 DIAGNOSIS — R32 Unspecified urinary incontinence: Secondary | ICD-10-CM | POA: Diagnosis not present

## 2024-06-18 DIAGNOSIS — E291 Testicular hypofunction: Secondary | ICD-10-CM | POA: Diagnosis not present

## 2024-06-18 DIAGNOSIS — N400 Enlarged prostate without lower urinary tract symptoms: Secondary | ICD-10-CM | POA: Diagnosis not present

## 2024-06-18 DIAGNOSIS — N529 Male erectile dysfunction, unspecified: Secondary | ICD-10-CM | POA: Diagnosis not present

## 2024-06-18 DIAGNOSIS — M542 Cervicalgia: Secondary | ICD-10-CM | POA: Diagnosis not present

## 2024-06-18 DIAGNOSIS — I10 Essential (primary) hypertension: Secondary | ICD-10-CM | POA: Diagnosis not present

## 2024-06-18 DIAGNOSIS — E785 Hyperlipidemia, unspecified: Secondary | ICD-10-CM | POA: Diagnosis not present

## 2024-06-18 DIAGNOSIS — K219 Gastro-esophageal reflux disease without esophagitis: Secondary | ICD-10-CM | POA: Diagnosis not present

## 2024-06-21 ENCOUNTER — Other Ambulatory Visit: Payer: Self-pay | Admitting: *Deleted

## 2024-06-21 ENCOUNTER — Other Ambulatory Visit

## 2024-06-21 ENCOUNTER — Telehealth: Payer: Self-pay | Admitting: "Endocrinology

## 2024-06-21 DIAGNOSIS — E236 Other disorders of pituitary gland: Secondary | ICD-10-CM

## 2024-06-21 DIAGNOSIS — E559 Vitamin D deficiency, unspecified: Secondary | ICD-10-CM

## 2024-06-21 DIAGNOSIS — E291 Testicular hypofunction: Secondary | ICD-10-CM

## 2024-06-21 DIAGNOSIS — Z86018 Personal history of other benign neoplasm: Secondary | ICD-10-CM

## 2024-06-21 NOTE — Telephone Encounter (Signed)
 Pt needs labs updated

## 2024-06-21 NOTE — Telephone Encounter (Signed)
 Labs are updated.

## 2024-06-22 LAB — CBC
Hematocrit: 49.4 % (ref 37.5–51.0)
Hemoglobin: 16.2 g/dL (ref 13.0–17.7)
MCH: 31.2 pg (ref 26.6–33.0)
MCHC: 32.8 g/dL (ref 31.5–35.7)
MCV: 95 fL (ref 79–97)
Platelets: 170 x10E3/uL (ref 150–450)
RBC: 5.19 x10E6/uL (ref 4.14–5.80)
RDW: 13.3 % (ref 11.6–15.4)
WBC: 3.5 x10E3/uL (ref 3.4–10.8)

## 2024-06-22 LAB — TESTOSTERONE,FREE AND TOTAL
Testosterone, Free: 34.6 pg/mL — ABNORMAL HIGH (ref 6.6–18.1)
Testosterone: >1500 ng/dL — ABNORMAL HIGH (ref 264–916)

## 2024-06-22 LAB — COMPREHENSIVE METABOLIC PANEL WITH GFR
ALT: 18 IU/L (ref 0–44)
AST: 17 IU/L (ref 0–40)
Albumin: 4.8 g/dL (ref 3.8–4.8)
Alkaline Phosphatase: 59 IU/L (ref 47–123)
BUN/Creatinine Ratio: 11 (ref 10–24)
BUN: 14 mg/dL (ref 8–27)
Bilirubin Total: 0.9 mg/dL (ref 0.0–1.2)
CO2: 24 mmol/L (ref 20–29)
Calcium: 10.1 mg/dL (ref 8.6–10.2)
Chloride: 98 mmol/L (ref 96–106)
Creatinine, Ser: 1.32 mg/dL — ABNORMAL HIGH (ref 0.76–1.27)
Globulin, Total: 2.4 g/dL (ref 1.5–4.5)
Glucose: 93 mg/dL (ref 70–99)
Potassium: 4.9 mmol/L (ref 3.5–5.2)
Sodium: 138 mmol/L (ref 134–144)
Total Protein: 7.2 g/dL (ref 6.0–8.5)
eGFR: 56 mL/min/1.73 — ABNORMAL LOW (ref >59)

## 2024-06-27 ENCOUNTER — Ambulatory Visit: Admitting: Urology

## 2024-06-27 VITALS — BP 143/87 | HR 78

## 2024-06-27 DIAGNOSIS — R350 Frequency of micturition: Secondary | ICD-10-CM

## 2024-06-27 DIAGNOSIS — N453 Epididymo-orchitis: Secondary | ICD-10-CM

## 2024-06-27 DIAGNOSIS — N138 Other obstructive and reflux uropathy: Secondary | ICD-10-CM | POA: Diagnosis not present

## 2024-06-27 DIAGNOSIS — N451 Epididymitis: Secondary | ICD-10-CM | POA: Diagnosis not present

## 2024-06-27 DIAGNOSIS — E291 Testicular hypofunction: Secondary | ICD-10-CM | POA: Diagnosis not present

## 2024-06-27 DIAGNOSIS — N401 Enlarged prostate with lower urinary tract symptoms: Secondary | ICD-10-CM

## 2024-06-27 MED ORDER — FINASTERIDE 5 MG PO TABS: 5.0000 mg | ORAL_TABLET | Freq: Every day | ORAL | 3 refills | Status: DC

## 2024-06-27 MED ORDER — SILDENAFIL CITRATE 100 MG PO TABS: 100.0000 mg | ORAL_TABLET | Freq: Every day | ORAL | 5 refills | Status: DC | PRN

## 2024-06-27 MED ORDER — TESTOSTERONE CYPIONATE 200 MG/ML IM SOLN: 100.0000 mg | INTRAMUSCULAR | 3 refills | Status: DC

## 2024-06-27 MED ORDER — DOXYCYCLINE HYCLATE 100 MG PO CAPS: 100.0000 mg | ORAL_CAPSULE | Freq: Two times a day (BID) | ORAL | 0 refills | Status: DC

## 2024-06-27 NOTE — Patient Instructions (Signed)
 Inflammation of the Epididymis (Epididymitis): What to Know  Epididymitis is inflammation or swelling of the epididymis. The epididymis is a coiled tube that's located along the top and back part of the testicle. It collects and stores sperm from the testicle. There are two types of epididymitis: Acute: Symptoms usually start suddenly. Chronic. Symptoms start slowly and last for a while. This type may be harder to treat. What are the causes? In males ages 19-40, epididymitis is usually caused by bacteria or a sexually transmitted infection (STI), such as gonorrhea or chlamydia. In males 88 and older, epididymitis is usually caused by bacteria. These bacteria can come from blocked pee or from abnormalities in the kidneys, ureters, urethra, or bladder (urinary tract). This can happen if: A soft tube was placed into your bladder (urinary catheter). You have an enlarged or inflamed prostate gland. You had surgery of the urinary tract. Urine flows backwards from your bladder to your body. In males who have a weakened body defense system (immune system), such as from the human immunodeficiency virus (HIV), this condition can be caused by: Other bacteria, including tuberculosis and syphilis. Viruses. Fungi. Sometimes epididymitis occurs without infection. This may happen because of an injury or repetitive activities such as sports. What increases the risk? You're more likely to get epididymitis if: You have unprotected sex with more than one partner. You have anal sex. You had recent surgery. You have a urinary catheter. You have urinary problems. You have a weak immune system. What are the signs or symptoms? Epididymitis usually begins suddenly with chills, fever, and pain behind the scrotum and in the testicle. Other symptoms include: Swelling of the scrotum, testicle, or both. Pain when ejaculating or peeing. Pain in the back or belly. Feeling like you may throw up. Itching and discharge  from the penis. Wanting to pee often. More warmth, redness, and tenderness of the scrotum. How is this diagnosed? Your health care provider can diagnose epididymitis based on: Your symptoms and medical history. A physical exam to check your scrotum and testicle for swelling, pain, and redness. Other tests, such as: Testing the discharge from the penis. Testing for infections, such as STIs and HIV. Using ultrasound to check for blood flow and inflammation. How is this treated? Treatment depends on the cause. For an infection from bacteria, you may be given antibiotics. If the infection has spread to your blood, you may be given antibiotics through an IV. For all causes of epididymitis, including bacteria, you may be treated with: Rest. Raising the scrotum. Pain medicines. Anti-inflammatory medicines. Surgery may be needed if: You have pus in the scrotum (abscess). You have epididymitis that has not responded to other treatments. Follow these instructions at home: Medicines Take your medicines only as told. If you were given antibiotics, take them as told. Do not stop taking them even if you start to feel better. Sex If your epididymitis was caused by an STI, avoid sex until your treatment is complete. Tell your sexual partner or partners if you test positive for an STI. They may need to be treated as well. Do not have sex with your partner or partners until their treatment is completed. Managing pain and swelling  If told, raise your scrotum and apply ice. To do this: Place a small towel or pillow between your legs. Rest your scrotum on the pillow or towel. Place another towel between your skin and the ice. Leave the ice on for 20 minutes, 2-3 times a day. If your skin  turns red, take off the ice right away to prevent skin damage. The risk of damage is higher if you can't feel pain, heat, or cold. Keep your scrotum raised and supported while you rest. Ask your provider if you  should wear a scrotal support, such as a jockstrap. Wear it as told by your provider. Try taking a sitz bath to help with discomfort. This is a warm water bath that's taken while sitting down. The water should come up to your hips and should cover your buttocks. Do this 3-4 times day or as told. General instructions Drink more fluid as told. Ask what things are safe for you to do at home. Ask when you can go back to work or school. Contact a health care provider if: You have a fever. Your pain medicine is not helping. Your pain is getting worse. Your symptoms don't get better within 3 days. This information is not intended to replace advice given to you by your health care provider. Make sure you discuss any questions you have with your health care provider. Document Revised: 11/10/2023 Document Reviewed: 11/10/2023 Elsevier Patient Education  2025 [employer].

## 2024-06-27 NOTE — Progress Notes (Unsigned)
 06/27/2024 9:52 AM   Michael Mills 29-Dec-1950 [id number]  Referring provider: Carlette Benita Area, MD [address] [place],  [place] [address]  Followup hypogonadism   HPI: Michael Mills is a 73yo here for followup for hypogonadism, BPH and erectile dysfunction. Testosterone  >1500, hemoglobin 16.2, creatinine 1.32. He injects 200mg  IM testosterone  every 2 weeks. IPSS 7 QOL 2 on finasteride  5mg  daily. Urine stream strong. No straining to urinate. No dysuria. He has having intermittent right testis pain for the past 4-6 weeks. He uses sildenafil  100mg  prn with good results   PMH: Past Medical History:  Diagnosis Date   Acid reflux    Acromegaly and pituitary gigantism (HCC)    Adrenal gland cancer (HCC)    Heart burn    High cholesterol    Hypertension    Pituitary gland disorder    Sleep apnea    Stomach ulcer    Vitamin D  deficiency     Surgical History: Past Surgical History:  Procedure Laterality Date   ADRENALECTOMY  2005   CHOLECYSTECTOMY     FACIAL RECONSTRUCTION SURGERY  1991   PROSTATE BIOPSY  2017    Home Medications:  Allergies as of 06/27/2024   No Known Allergies      Medication List        Accurate as of June 27, 2024  9:52 AM. If you have any questions, ask your nurse or doctor.          amoxicillin 500 MG capsule Commonly known as: AMOXIL Take 500 mg by mouth 3 (three) times daily.   ascorbic acid 500 MG tablet Commonly known as: VITAMIN C Take 500 mg by mouth daily.   carvedilol 12.5 MG tablet Commonly known as: COREG daily at 6 (six) AM.   finasteride  5 MG tablet Commonly known as: PROSCAR  Take 1 tablet (5 mg total) by mouth daily.   hydrochlorothiazide 25 MG tablet Commonly known as: HYDRODIURIL daily at 6 (six) AM.   ibuprofen 800 MG tablet Commonly known as: ADVIL Take 800 mg by mouth every 6 (six) hours as needed.   lovastatin 20 MG tablet Commonly known as: MEVACOR Take 20 mg by mouth daily.    pregabalin 75 MG capsule Commonly known as: LYRICA daily at 6 (six) AM.   SENNOSIDES PO Take 2 tablets by mouth daily.   sildenafil  100 MG tablet Commonly known as: VIAGRA  Take 1 tablet (100 mg total) by mouth daily as needed for erectile dysfunction.   tadalafil  20 MG tablet Commonly known as: CIALIS  Take 1 tablet (20 mg total) by mouth daily as needed.   testosterone  cypionate 200 MG/ML injection Commonly known as: DEPOTESTOSTERONE CYPIONATE Inject 0.5 mLs (100 mg total) into the muscle every 14 (fourteen) days.   tobramycin-dexamethasone ophthalmic solution Commonly known as: TOBRADEX daily at 6 (six) AM.        Allergies: No Known Allergies  Family History: Family History  Problem Relation Age of Onset   Cancer Mother 49       breast?   Heart disease Father    Diabetes Mellitus II Father    Colon cancer Neg Hx     Social History:  reports that he has never smoked. He has never used smokeless tobacco. He reports that he does not drink alcohol  and does not use drugs.  ROS: All other review of systems were reviewed and are negative except what is noted above in HPI  Physical Exam: BP (!) 143/87   Pulse 78  Constitutional:  Alert and oriented, No acute distress. HEENT: [place] AT, moist mucus membranes.  Trachea midline, no masses. Cardiovascular: No clubbing, cyanosis, or edema. Respiratory: Normal respiratory effort, no increased work of breathing. GI: Abdomen is soft, nontender, nondistended, no abdominal masses GU: No CVA tenderness.  Lymph: No cervical or inguinal lymphadenopathy. Skin: No rashes, bruises or suspicious lesions. Neurologic: Grossly intact, no focal deficits, moving all 4 extremities. Psychiatric: Normal mood and affect.  Laboratory Data: Lab Results  Component Value Date   WBC 3.5 06/21/2024   HGB 16.2 06/21/2024   HCT 49.4 06/21/2024   MCV 95 06/21/2024   PLT 170 06/21/2024    Lab Results  Component Value Date   CREATININE 1.32  (H) 06/21/2024    No results found for: PSA  Lab Results  Component Value Date   TESTOSTERONE  >1500 (H) 06/21/2024    No results found for: HGBA1C  Urinalysis    Component Value Date/Time   APPEARANCEUR Clear 06/29/2023 1535   GLUCOSEU Negative 06/29/2023 1535   BILIRUBINUR Negative 06/29/2023 1535   PROTEINUR Negative 06/29/2023 1535   NITRITE Negative 06/29/2023 1535   LEUKOCYTESUR Negative 06/29/2023 1535    Lab Results  Component Value Date   LABMICR See below: 06/29/2023   WBCUA None seen 06/29/2023   LABEPIT 0-10 06/29/2023   MUCUS Present 07/10/2021   BACTERIA None seen 06/29/2023    Pertinent Imaging: *** No results found for this or any previous visit.  No results found for this or any previous visit.  No results found for this or any previous visit.  No results found for this or any previous visit.  No results found for this or any previous visit.  No results found for this or any previous visit.  No results found for this or any previous visit.  No results found for this or any previous visit.   Assessment & Plan:    1. 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 Benign prostatic hyperplasia with urinary obstruction ***  3. Urinary frequency ***   No follow-ups on file.  [name], MD  [facility] [place]

## 2024-06-28 ENCOUNTER — Ambulatory Visit (HOSPITAL_COMMUNITY): Payer: Self-pay | Admitting: Anesthesiology

## 2024-06-28 ENCOUNTER — Ambulatory Visit (HOSPITAL_COMMUNITY)
Admission: RE | Admit: 2024-06-28 | Discharge: 2024-06-28 | Disposition: A | Discharge status: Home / Self Care | Attending: Internal Medicine | Admitting: Internal Medicine

## 2024-06-28 ENCOUNTER — Encounter (HOSPITAL_COMMUNITY)
Admission: RE | Disposition: A | Discharge status: Home / Self Care | Payer: Self-pay | Source: Home / Self Care | Attending: Internal Medicine

## 2024-06-28 ENCOUNTER — Encounter (HOSPITAL_COMMUNITY): Payer: Self-pay | Admitting: Internal Medicine

## 2024-06-28 ENCOUNTER — Encounter: Payer: Self-pay | Admitting: Urology

## 2024-06-28 ENCOUNTER — Other Ambulatory Visit: Payer: Self-pay

## 2024-06-28 DIAGNOSIS — K5730 Diverticulosis of large intestine without perforation or abscess without bleeding: Secondary | ICD-10-CM | POA: Diagnosis not present

## 2024-06-28 DIAGNOSIS — I10 Essential (primary) hypertension: Secondary | ICD-10-CM | POA: Insufficient documentation

## 2024-06-28 DIAGNOSIS — D122 Benign neoplasm of ascending colon: Secondary | ICD-10-CM | POA: Diagnosis not present

## 2024-06-28 DIAGNOSIS — K635 Polyp of colon: Secondary | ICD-10-CM

## 2024-06-28 DIAGNOSIS — K5790 Diverticulosis of intestine, part unspecified, without perforation or abscess without bleeding: Secondary | ICD-10-CM

## 2024-06-28 DIAGNOSIS — G4730 Sleep apnea, unspecified: Secondary | ICD-10-CM | POA: Insufficient documentation

## 2024-06-28 DIAGNOSIS — R194 Change in bowel habit: Secondary | ICD-10-CM | POA: Diagnosis not present

## 2024-06-28 DIAGNOSIS — K219 Gastro-esophageal reflux disease without esophagitis: Secondary | ICD-10-CM | POA: Diagnosis not present

## 2024-06-28 HISTORY — PX: COLONOSCOPY: SHX5424

## 2024-06-28 SURGERY — COLONOSCOPY
Anesthesia: Monitor Anesthesia Care

## 2024-06-28 MED ORDER — PROPOFOL 500 MG/50ML IV EMUL
INTRAVENOUS | Status: DC | PRN
  Administered 2024-06-28 (×2): 150 ug/kg/min via INTRAVENOUS

## 2024-06-28 MED ORDER — PROPOFOL 10 MG/ML IV BOLUS
INTRAVENOUS | Status: DC | PRN
  Administered 2024-06-28: 100 mg via INTRAVENOUS

## 2024-06-28 MED ORDER — LACTATED RINGERS IV SOLN: INTRAVENOUS | Status: DC

## 2024-06-28 NOTE — Interval H&P Note (Signed)
 History and Physical Interval Note:  06/28/2024 8:50 AM  Michael Mills  has presented today for surgery, with the diagnosis of change in bowel habits.  The various methods of treatment have been discussed with the patient and family. After consideration of risks, benefits and other options for treatment, the patient has consented to  Procedure(s) with comments: COLONOSCOPY (N/A) - 9:45 am, ok rm 1-2 as a surgical intervention.  The patient's history has been reviewed, patient examined, no change in status, stable for surgery.  I have reviewed the patient's chart and labs.  Questions were answered to the patient's satisfaction.     Michael Mills  No change.   Patient constipated recently but effectively managed with addition of MiraLAX. Diagnostic colonoscopy per plan.  The risks, benefits, limitations, alternatives and imponderables have been reviewed with the patient. Questions have been answered. All parties are agreeable.

## 2024-06-28 NOTE — Op Note (Signed)
 [facility] Patient Name: Michael Mills Procedure Date: 06/28/2024 8:34 AM MRN: [id number] Date of Birth: 02-02-1951 Attending MD: Lamar Ozell Hollingshead , MD, [id number] CSN: [id number] Age: 73 Admit Type: Outpatient Procedure:                Colonoscopy Indications:              Change in bowel habits Providers:                Lamar Ozell Hollingshead, MD, Jon LABOR. Gerome RN, RN,                            Chad Wilson, Technician Referring MD:              Medicines:                Propofol per Anesthesia Complications:            No immediate complications. Estimated Blood Loss:     Estimated blood loss was minimal. Procedure:                Pre-Anesthesia Assessment:                           - Prior to the procedure, a History and Physical                            was performed, and patient medications and                            allergies were reviewed. The patient's tolerance of                            previous anesthesia was also reviewed. The risks                            and benefits of the procedure and the sedation                            options and risks were discussed with the patient.                            All questions were answered, and informed consent                            was obtained. Prior Anticoagulants: The patient has                            taken no anticoagulant or antiplatelet agents. ASA                            Grade Assessment: III - A patient with severe                            systemic disease. After reviewing the risks and  benefits, the patient was deemed in satisfactory                            condition to undergo the procedure.                           After obtaining informed consent, the colonoscope                            was passed under direct vision. Throughout the                            procedure, the patient's blood pressure, pulse, and                             oxygen saturations were monitored continuously. The                            [id number] ([id number]) Colon was introduced through                            the anus and advanced to the the cecum, identified                            by appendiceal orifice and ileocecal valve. The                            ileocecal valve, appendiceal orifice, and rectum                            were photographed. Scope In: 9:03:50 AM Scope Out: 9:19:40 AM Scope Withdrawal Time: 0 hours 9 minutes 28 seconds  Total Procedure Duration: 0 hours 15 minutes 50 seconds  Findings:      The perianal and digital rectal examinations were normal.      Scattered medium-mouthed diverticula were found in the sigmoid colon.      Two sessile polyps were found in the ascending colon. The polyps were 3       to 4 mm in size. These polyps were removed with a cold snare. Resection       and retrieval were complete. Estimated blood loss was minimal.      The exam was otherwise without abnormality on direct and retroflexion       views. Impression:               - Diverticulosis in the sigmoid colon.                           - Two 3 to 4 mm polyps in the ascending colon,                            removed with a cold snare. Resected and retrieved.                           - The examination was otherwise normal on direct  and retroflexion views. Moderate Sedation:      Moderate (conscious) sedation was personally administered by an       anesthesia professional. The following parameters were monitored: oxygen       saturation, heart rate, blood pressure, respiratory rate, EKG, adequacy       of pulmonary ventilation, and response to care. Recommendation:           - Patient has a contact number available for                            emergencies. The signs and symptoms of potential                            delayed complications were discussed with the                            patient.  Return to normal activities tomorrow.                            Written discharge instructions were provided to the                            patient.                           - Advance diet as tolerated.                           - Continue present medications.                           - Repeat colonoscopy date to be determined after                            pending pathology results are reviewed for                            surveillance.                           - Return to GI office in 2 months to assess for                            possible liver issues. Procedure Code(s):        --- Professional ---                           [phone], Colonoscopy, flexible; with removal of                            tumor(s), polyp(s), or other lesion(s) by snare                            technique Diagnosis Code(s):        --- Professional ---  D12.2, Benign neoplasm of ascending colon                           R19.4, Change in bowel habit                           K57.30, Diverticulosis of large intestine without                            perforation or abscess without bleeding CPT copyright 2022 American Medical Association. All rights reserved. The codes documented in this report are preliminary and upon coder review may  be revised to meet current compliance requirements. [name] [name], MD Lamar Ozell Hollingshead, MD 06/28/2024 10:17:31 AM This report has been signed electronically. Number of Addenda: 0

## 2024-06-28 NOTE — Transfer of Care (Signed)
 Immediate Anesthesia Transfer of Care Note  Patient: Michael Mills  Procedure(s) Performed: COLONOSCOPY  Patient Location: Endoscopy Unit  Anesthesia Type:General  Level of Consciousness: awake, alert , and oriented  Airway & Oxygen Therapy: Patient Spontanous Breathing  Post-op Assessment: Report given to RN, Post -op Vital signs reviewed and stable, and Patient moving all extremities X 4  Post vital signs: Reviewed and stable  Last Vitals:  Vitals Value Taken Time  BP 109/69 06/28/24 09:34  Temp 36.4 C 06/28/24 09:24  Pulse 71 06/28/24 09:34  Resp 22 06/28/24 09:34  SpO2 97 % 06/28/24 09:34    Last Pain:  Vitals:   06/28/24 0934  TempSrc:   PainSc: 0-No pain      Patients Stated Pain Goal: 4 (06/28/24 0833)  Complications: No notable events documented.

## 2024-06-28 NOTE — Anesthesia Preprocedure Evaluation (Signed)
 Anesthesia Evaluation  Patient identified by MRN, date of birth, ID band Patient awake    Reviewed: Allergy & Precautions, H&P , NPO status , Patient's Chart, lab work & pertinent test results, reviewed documented beta blocker date and time   Airway Mallampati: II  TM Distance: >3 FB Neck ROM: full    Dental no notable dental hx.    Pulmonary neg pulmonary ROS, sleep apnea    Pulmonary exam normal breath sounds clear to auscultation       Cardiovascular Exercise Tolerance: Good hypertension, negative cardio ROS  Rhythm:regular Rate:Normal     Neuro/Psych negative neurological ROS  negative psych ROS   GI/Hepatic negative GI ROS, Neg liver ROS, PUD,GERD  ,,  Endo/Other  negative endocrine ROS    Renal/GU negative Renal ROS  negative genitourinary   Musculoskeletal   Abdominal   Peds  Hematology negative hematology ROS (+)   Anesthesia Other Findings   Reproductive/Obstetrics negative OB ROS                              Anesthesia Physical Anesthesia Plan  ASA: 2  Anesthesia Plan: MAC   Post-op Pain Management:    Induction:   PONV Risk Score and Plan: Propofol infusion  Airway Management Planned:   Additional Equipment:   Intra-op Plan:   Post-operative Plan:   Informed Consent: I have reviewed the patients History and Physical, chart, labs and discussed the procedure including the risks, benefits and alternatives for the proposed anesthesia with the patient or authorized representative who has indicated his/her understanding and acceptance.     Dental Advisory Given  Plan Discussed with: CRNA  Anesthesia Plan Comments:         Anesthesia Quick Evaluation

## 2024-06-28 NOTE — Discharge Instructions (Addendum)
  You have diverticulosis.  2 small polyps found and removed  Continue MiraLAX daily.  Continue a fiber supplement daily.  I recommend Benefiber 2 tablespoons daily  Office visit with Sonny Kerns in 2 months To evaluate possible liver issues. Message sent to office

## 2024-06-29 ENCOUNTER — Encounter (HOSPITAL_COMMUNITY): Payer: Self-pay | Admitting: Internal Medicine

## 2024-06-29 LAB — SURGICAL PATHOLOGY

## 2024-06-30 ENCOUNTER — Ambulatory Visit: Payer: Self-pay | Admitting: Internal Medicine

## 2024-07-01 NOTE — Anesthesia Postprocedure Evaluation (Signed)
 Anesthesia Post Note  Patient: Michael Mills  Procedure(s) Performed: COLONOSCOPY  Patient location during evaluation: Phase II Anesthesia Type: MAC Level of consciousness: awake Pain management: pain level controlled Vital Signs Assessment: post-procedure vital signs reviewed and stable Respiratory status: spontaneous breathing and respiratory function stable Cardiovascular status: blood pressure returned to baseline and stable Postop Assessment: no headache and no apparent nausea or vomiting Anesthetic complications: no Comments: Late entry   No notable events documented.   Last Vitals:  Vitals:   06/28/24 0929 06/28/24 0934  BP: 98/69 109/69  Pulse: 85 71  Resp: 19 (!) 22  Temp:    SpO2: 96% 97%    Last Pain:  Vitals:   06/28/24 0934  TempSrc:   PainSc: 0-No pain                 Yvonna JINNY Bosworth

## 2024-07-18 DIAGNOSIS — I10 Essential (primary) hypertension: Secondary | ICD-10-CM | POA: Diagnosis not present

## 2024-07-18 DIAGNOSIS — E785 Hyperlipidemia, unspecified: Secondary | ICD-10-CM | POA: Diagnosis not present

## 2024-08-10 ENCOUNTER — Encounter: Payer: Self-pay | Admitting: Internal Medicine

## 2024-08-29 ENCOUNTER — Other Ambulatory Visit: Payer: Self-pay

## 2024-08-29 DIAGNOSIS — Z86018 Personal history of other benign neoplasm: Secondary | ICD-10-CM

## 2024-08-29 DIAGNOSIS — E559 Vitamin D deficiency, unspecified: Secondary | ICD-10-CM

## 2024-08-29 DIAGNOSIS — E236 Other disorders of pituitary gland: Secondary | ICD-10-CM

## 2024-09-02 ENCOUNTER — Ambulatory Visit: Payer: Medicare HMO | Admitting: "Endocrinology

## 2024-09-06 ENCOUNTER — Ambulatory Visit: Admitting: "Endocrinology

## 2024-09-07 LAB — TSH: TSH: 2.06 u[IU]/mL (ref 0.450–4.500)

## 2024-09-07 LAB — CORTISOL-AM, BLOOD: Cortisol - AM: 14.1 ug/dL (ref 6.2–19.4)

## 2024-09-07 LAB — PROLACTIN: Prolactin: 8.7 ng/mL (ref 3.6–25.2)

## 2024-09-07 LAB — T4, FREE: Free T4: 1.2 ng/dL (ref 0.82–1.77)

## 2024-09-16 ENCOUNTER — Ambulatory Visit (HOSPITAL_COMMUNITY)
Admission: RE | Admit: 2024-09-16 | Discharge: 2024-09-16 | Disposition: A | Source: Ambulatory Visit | Attending: Gerontology | Admitting: Gerontology

## 2024-09-16 ENCOUNTER — Other Ambulatory Visit (HOSPITAL_COMMUNITY): Payer: Self-pay | Admitting: Gerontology

## 2024-09-16 DIAGNOSIS — R059 Cough, unspecified: Secondary | ICD-10-CM | POA: Diagnosis present

## 2024-09-21 ENCOUNTER — Encounter: Payer: Self-pay | Admitting: "Endocrinology

## 2024-09-21 ENCOUNTER — Ambulatory Visit: Admitting: "Endocrinology

## 2024-09-21 VITALS — BP 100/64 | HR 72 | Ht 69.0 in | Wt 185.8 lb

## 2024-09-21 DIAGNOSIS — E236 Other disorders of pituitary gland: Secondary | ICD-10-CM

## 2024-09-21 DIAGNOSIS — Z86018 Personal history of other benign neoplasm: Secondary | ICD-10-CM | POA: Diagnosis not present

## 2024-09-21 DIAGNOSIS — E559 Vitamin D deficiency, unspecified: Secondary | ICD-10-CM | POA: Diagnosis not present

## 2024-09-21 NOTE — Progress Notes (Signed)
 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[facility])    Adrenal gland cancer (HCC)    Heart burn    High cholesterol    Hypertension    Pituitary gland disorder    Sleep apnea    Stomach ulcer    Vitamin D  deficiency    Past Surgical History:  Procedure Laterality Date   ADRENALECTOMY  2005   CHOLECYSTECTOMY     COLONOSCOPY N/A 06/28/2024   Procedure: COLONOSCOPY;  Surgeon: Michael Lamar HERO, MD;  Location: [facility]O [facility];  Service: Endoscopy;  Laterality: N/A;  9:45 am, ok rm 1-2   FACIAL RECONSTRUCTION SURGERY  1991   PROSTATE BIOPSY  2017   Social History   Socioeconomic History   Marital status: Married    Spouse name: Not on file   Number of children: 2   Years of education: Not on file   Highest education level: Not on file  Occupational History   Not on file  Tobacco Use   Smoking status: Never   Smokeless tobacco: Never  Vaping Use   Vaping status: Never Used  Substance and Sexual Activity   Alcohol  use: Never   Drug use: Never   Sexual activity: Not on file  Other Topics Concern   Not on file  Social History Narrative   Not on file   Social Drivers of Health   Tobacco Use: Low Risk (06/28/2024)   Patient History    Smoking Tobacco Use: Never    Smokeless Tobacco Use: Never    Passive Exposure: Not on file  Financial Resource Strain: Not on file  Food Insecurity: Not on file  Transportation Needs: Not on file  Physical Activity: Not on file  Stress: Not on file  Social Connections: Not on file  Depression (EYV7-0): Not on file  Alcohol  Screen: Not on file  Housing: Not on file  Utilities: Not on file  Health Literacy: Not on file   Family History  Problem Relation Age of Onset   Cancer  Mother 26       breast?   Heart disease Father    Diabetes Mellitus II Father    Colon cancer Neg Hx    Outpatient Encounter Medications as of 09/21/2024  Medication Sig   benzonatate (TESSALON) 100 MG capsule Take 100 mg by mouth 3 (three) times daily as needed.   doxycycline  (VIBRAMYCIN ) 100 MG capsule Take 100 mg by mouth every 12 (twelve) hours.   finasteride  (PROSCAR ) 5 MG tablet Take 5 mg by mouth daily.   hydrochlorothiazide (HYDRODIURIL) 25 MG tablet Take 25 mg by mouth daily.   lovastatin (MEVACOR) 20 MG tablet Take 20 mg by mouth daily.   promethazine-dextromethorphan (PROMETHAZINE-DM) 6.25-15 MG/5ML syrup Take 5 mLs by mouth every 6 (six) hours as needed.   sildenafil  (VIAGRA ) 100 MG tablet    No facility-administered encounter medications on file as of 09/21/2024.   ALLERGIES: No Known Allergies  VACCINATION STATUS:  There is no immunization history on file for this patient.  HPI Michael Mills is 74 y.o. male who presents today for follow-up after he was seen in consultation  due to his history of  adrenal adenoma s/p resection in 2009 , with subsequent endocrine work-up being in still in normal ranges.    See notes from his last visit.  This consult is from  Fanta, Tesfaye Demissie, MD related to his prior history.  he is on ongoing treatment with testosterone  by his local urologist.      He also has documented history of pituitary cyst on MRI studies done in 2017 and 2019 with no interval change.  A CT scan of brain performed in December 2020 did not show any changes or acute intracranial abnormality.  He has no new complaints today.  He denies headaches, no visual field loss/deficit.     Abdominal CT performed in September 2024 did not show any adrenal lesions. 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   He is currently being treated for hypertension on a stable regimen of Coreg 12.5 mg p.o. twice daily, hydrochlorothiazide 25 mg p.o.  daily.   -He is on Lyrica for postherpetic neuralgia.  Review of Systems   Objective:       09/21/2024    9:39 AM 06/28/2024    9:34 AM 06/28/2024    9:29 AM  Vitals with BMI  Height 5' 9    Weight 185 lbs 13 oz    BMI 27.43    Systolic 100 109 98  Diastolic 64 69 69  Pulse 72 71 85    BP 100/64   Pulse 72   Ht 5' 9 (1.753 m)   Wt 185 lb 12.8 oz (84.3 kg)   BMI 27.44 kg/m   Wt Readings from Last 3 Encounters:  09/21/24 185 lb 12.8 oz (84.3 kg)  06/08/24 187 lb 12.8 oz (85.2 kg)  09/03/23 189 lb 12.8 oz (86.1 kg)    Physical Exam    CMP ( most recent) CMP     Component Value Date/Time   NA 138 06/21/2024 0821   K 4.9 06/21/2024 0821   CL 98 06/21/2024 0821   CO2 24 06/21/2024 0821   GLUCOSE 93 06/21/2024 0821   BUN 14 06/21/2024 0821   CREATININE 1.32 (H) 06/21/2024 0821   CALCIUM 10.1 06/21/2024 0821   PROT 7.2 06/21/2024 0821   ALBUMIN 4.8 06/21/2024 0821   AST 17 06/21/2024 0821   ALT 18 06/21/2024 0821   ALKPHOS 59 06/21/2024 0821   BILITOT 0.9 06/21/2024 0821   GFRNONAA 76 07/24/2020 1016   GFRAA 88 07/24/2020 1016   Recent Results (from the past 2160 hours)  Surgical pathology     Status: None   Collection Time: 06/28/24  9:13 AM  Result Value Ref Range   SURGICAL PATHOLOGY      SURGICAL PATHOLOGY CASE: [id number] PATIENT: Michael Mills Surgical Pathology Report     Clinical History: Change in bowel habits (las)     FINAL MICROSCOPIC DIAGNOSIS:  A. COLON, ASCENDING, POLYPECTOMY: -  Tubular adenoma, fragments.    GROSS DESCRIPTION:  Received in formalin are tan, soft tissue fragments that are submitted in toto. Number: 2 size: 0.6 and 1.0 cm blocks: 1 (KW, 06/28/2024)   Final Diagnosis performed by Michael LeGolvan DO.   Electronically signed 06/29/2024 Technical component performed at [facility], [address], [place], [place] [address].  Professional component performed at  [facility], [address] [place], [place] [address].  Immunohistochemistry Technical component (if applicable) was performed at [facility]. [address], [address], [place], [place] [address].   IMMUNOHISTOCHEMISTRY DISCLAIMER (if applicable): Some of these immunohistochemical stains may  have been developed and the performance characteristics determine by [facility]. Some may not have been cleared or approved by the U.S. Food and Drug Administration. The FDA has determined that such clearance or approval is not necessary. This test is used for clinical purposes. It should not be regarded as investigational or for research. This laboratory is certified under the Clinical Laboratory Improvement Amendments of 1988 (CLIA-88) as qualified to perform high complexity clinical laboratory testing.  The controls stained appropriately.   IHC stains are performed on formalin fixed, paraffin embedded tissue using a 3,3diaminobenzidine (DAB) chromogen and Leica Bond Autostainer System. The staining intensity of the nucleus is score manually and is reported as the percentage of tumor cell nuclei demonstrating specific nuclear staining. The specimens are fixed in 10% Neutral Formalin for at least 6 hours and up to 72hrs. These tests are validated on decalc ified tissue. Results should be interpreted with caution given the possibility of false negative results on decalcified specimens. Antibody Clones are as follows ER-clone 39F, PR-clone 16, Ki67- clone MM1. Some of these immunohistochemical stains may have been developed and the performance characteristics determined by [facility].   Cortisol-am, blood     Status: None   Collection Time: 09/06/24  8:09 AM  Result Value Ref Range   Cortisol - AM 14.1 6.2 - 19.4 ug/dL  T4, Free     Status: None   Collection Time: 09/06/24  8:09 AM  Result Value Ref Range   Free T4 1.20 0.82 - 1.77 ng/dL  TSH     Status:  None   Collection Time: 09/06/24  8:09 AM  Result Value Ref Range   TSH 2.060 0.450 - 4.500 uIU/mL  Prolactin     Status: None   Collection Time: 09/06/24  8:09 AM  Result Value Ref Range   Prolactin 8.7 3.6 - 25.2 ng/mL     MRI of pituitary/brain without contrast performed on August 20, 2017 showed stable left-sided benign appearing 7.5 x 4 mm cyst/cystic focus in the upper posterior left pituitary gland.   Assessment & Plan:   1. History of adrenal adenoma -He is status post adenoma resection in 2009 in [place] . The details of his surgery and reports are not available to review.  His subsequent endocrine workup remains stable/unremarkable.    He is not on adrenal hormone replacement.    He has well-controlled hypertension on carvedilol and HCTZ.  His recent abdominal CT did not show adrenal lesions.  He will have repeat prolactin, a.m. cortisol and thyroid  function tests in a year.  2.  Pituitary cyst (HCC) -Two MRI studies between  2017 and 2019 did not show significant change in his documented tiny pituitary cyst measuring 7.5 mm. A 2020 CT scan of the head did not show any new findings.  His thyroid  function test, prolactin, a.m. cortisol as well as IGF-I have been documented to be within normal limits.  No further intervention needed.     He will return to clinic as needed. - he is advised to maintain close follow up with Fanta, Tesfaye Demissie, MD for primary care needs.   I spent  20  minutes in the care of the patient today including review of labs  from Thyroid  Function, CMP, and other relevant labs ; imaging/biopsy records (current and previous including abstractions from other facilities); face-to-face time discussing  his lab results and symptoms, medications doses, his options of short and long term treatment based on the latest standards of care / guidelines;   and documenting the encounter.  Michael Mills  participated in the discussions, expressed understanding,  and voiced agreement with the above plans.  All questions were answered to his satisfaction. he is encouraged to contact clinic should he have any questions or concerns prior to his return visit. Dear Patient: Feel free to review your progress notes.  If you are reviewing this progress note and have questions about the meaning of /or medical terms being used, please make a note and address it at your next follow-up appointment.  Medical notes are meant to be a communication tool between medical professionals and require medical terms to be used for efficiency and insurance approval.   Follow up plan: Return if symptoms worsen or fail to improve.   Ranny Earl, MD [facility] [address] [place], [place] [address] Phone: [phone]  Fax: [phone]     09/21/2024, 9:52 AM  This note was partially dictated with voice recognition software. Similar sounding words can be transcribed inadequately or may not  be corrected upon review.  "

## 2024-09-22 ENCOUNTER — Ambulatory Visit: Admitting: "Endocrinology

## 2024-12-23 ENCOUNTER — Other Ambulatory Visit

## 2024-12-28 ENCOUNTER — Ambulatory Visit: Admitting: Urology
